# Patient Record
Sex: Female | Born: 1970
Health system: Southern US, Community
[De-identification: ages and names within clinical notes are randomized; demographics above are authoritative.]

## PROBLEM LIST (undated history)

## (undated) DIAGNOSIS — E079 Disorder of thyroid, unspecified: Secondary | ICD-10-CM

## (undated) DIAGNOSIS — R200 Anesthesia of skin: Secondary | ICD-10-CM

## (undated) DIAGNOSIS — R519 Headache, unspecified: Secondary | ICD-10-CM

## (undated) DIAGNOSIS — H539 Unspecified visual disturbance: Secondary | ICD-10-CM

## (undated) DIAGNOSIS — R51 Headache: Secondary | ICD-10-CM

## (undated) DIAGNOSIS — M5416 Radiculopathy, lumbar region: Secondary | ICD-10-CM

## (undated) DIAGNOSIS — R202 Paresthesia of skin: Secondary | ICD-10-CM

## (undated) HISTORY — DX: Unspecified visual disturbance: H53.9

## (undated) HISTORY — DX: Headache: R51

## (undated) HISTORY — DX: Disorder of thyroid, unspecified: E07.9

## (undated) HISTORY — DX: Anesthesia of skin: R20.2

## (undated) HISTORY — DX: Headache, unspecified: R51.9

## (undated) HISTORY — DX: Anesthesia of skin: R20.0

## (undated) HISTORY — PX: WISDOM TOOTH EXTRACTION: SHX21

---

## 2012-07-20 ENCOUNTER — Other Ambulatory Visit: Payer: Self-pay | Admitting: Gastroenterology

## 2012-07-20 DIAGNOSIS — R1011 Right upper quadrant pain: Secondary | ICD-10-CM

## 2012-07-22 ENCOUNTER — Ambulatory Visit
Admission: RE | Admit: 2012-07-22 | Discharge: 2012-07-22 | Disposition: A | Discharge status: Home / Self Care | Payer: BC Managed Care – PPO | Source: Ambulatory Visit | Attending: Gastroenterology | Admitting: Gastroenterology

## 2012-07-22 DIAGNOSIS — R1011 Right upper quadrant pain: Secondary | ICD-10-CM

## 2013-07-12 ENCOUNTER — Other Ambulatory Visit: Payer: Self-pay | Admitting: Obstetrics and Gynecology

## 2013-07-12 DIAGNOSIS — R928 Other abnormal and inconclusive findings on diagnostic imaging of breast: Secondary | ICD-10-CM

## 2013-07-28 ENCOUNTER — Encounter (INDEPENDENT_AMBULATORY_CARE_PROVIDER_SITE_OTHER): Payer: Self-pay

## 2013-07-28 ENCOUNTER — Ambulatory Visit
Admission: RE | Admit: 2013-07-28 | Discharge: 2013-07-28 | Disposition: A | Discharge status: Home / Self Care | Payer: BC Managed Care – PPO | Source: Ambulatory Visit | Attending: Obstetrics and Gynecology | Admitting: Obstetrics and Gynecology

## 2013-07-28 ENCOUNTER — Other Ambulatory Visit: Payer: Self-pay | Admitting: Obstetrics and Gynecology

## 2013-07-28 DIAGNOSIS — R928 Other abnormal and inconclusive findings on diagnostic imaging of breast: Secondary | ICD-10-CM

## 2013-12-29 ENCOUNTER — Other Ambulatory Visit: Payer: Self-pay | Admitting: Obstetrics and Gynecology

## 2013-12-29 DIAGNOSIS — N6310 Unspecified lump in the right breast, unspecified quadrant: Secondary | ICD-10-CM

## 2014-01-17 ENCOUNTER — Ambulatory Visit
Admission: RE | Admit: 2014-01-17 | Discharge: 2014-01-17 | Disposition: A | Discharge status: Home / Self Care | Payer: BC Managed Care – PPO | Source: Ambulatory Visit | Attending: Obstetrics and Gynecology | Admitting: Obstetrics and Gynecology

## 2014-01-17 DIAGNOSIS — N6310 Unspecified lump in the right breast, unspecified quadrant: Secondary | ICD-10-CM

## 2014-05-09 ENCOUNTER — Other Ambulatory Visit: Payer: Self-pay | Admitting: Obstetrics and Gynecology

## 2014-05-09 DIAGNOSIS — N6001 Solitary cyst of right breast: Secondary | ICD-10-CM

## 2014-05-11 ENCOUNTER — Ambulatory Visit
Admission: RE | Admit: 2014-05-11 | Discharge: 2014-05-11 | Disposition: A | Discharge status: Home / Self Care | Payer: BLUE CROSS/BLUE SHIELD | Source: Ambulatory Visit | Attending: Obstetrics and Gynecology | Admitting: Obstetrics and Gynecology

## 2014-05-11 DIAGNOSIS — N6001 Solitary cyst of right breast: Secondary | ICD-10-CM

## 2014-12-04 ENCOUNTER — Encounter: Payer: Self-pay | Admitting: Neurology

## 2014-12-04 ENCOUNTER — Ambulatory Visit (INDEPENDENT_AMBULATORY_CARE_PROVIDER_SITE_OTHER): Payer: BLUE CROSS/BLUE SHIELD | Admitting: Neurology

## 2014-12-04 VITALS — BP 122/80 | HR 84 | Ht 65.0 in | Wt 135.0 lb

## 2014-12-04 DIAGNOSIS — R202 Paresthesia of skin: Secondary | ICD-10-CM | POA: Diagnosis not present

## 2014-12-04 DIAGNOSIS — G43009 Migraine without aura, not intractable, without status migrainosus: Secondary | ICD-10-CM

## 2014-12-04 NOTE — Progress Notes (Signed)
PATIENT: Renee [name] DOB: 18-Apr-1970  Chief Complaint  Patient presents with  . 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  She describes it as having "wavy" peripheral vision.  The symptoms last 10-15 minutes and resolved on their own.  She did not have an active headache at the time.  . Numbness    She has had two episodes where she feels a heaviness in the back of her head.  She has also noticed tingling and cold sensations in her left arm.  She has mild neck pain.  . Fatigue    She has noticed increased fatigue, especially in the afternoon.   Marland Kitchen Headache    Reports getting frequent headaches.     HISTORICAL  Renee [name] is a 44 years old right-handed female, seen in refer by her primary care physician nurse practitioner Juanda Chance for evaluation of left-sided numbness  She is a Designer, multimedia, reported a history of migraine, which usually clustered around her menstruation period of time, or induced by exertion, lateralized severe pounding headaches with associated light noise sensitivity, nauseous.  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, she noticed visual distortion, as if it was wavy, last about 15 minutes, but there was no headaches,   She also reported one episode of headaches starting from right occipital region, sick in her stomach, pressure pain at right retro-orbital area, with associated left arm numbness, lasted for a few hours, gradually resolved,   Sometimes she get left retro-orbital area pressure headaches, with associated light noise sensitivity, nauseous,   Trigger for her headaches are stress, sleep deprivation, menstruation period of time   I reviewed most recent laboratory evaluation May 2016, normal CBC, with hemoglobin of 13.8, normal CMP.  REVIEW OF SYSTEMS: Full 14 system review of systems performed and notable only for: Fatigue, visual distortion,  headaches, numbness, dizziness, restless legs, anxiety, decreased energy, disinteresting activities, racing thoughts.  ALLERGIES: No Known Allergies  HOME MEDICATIONS: Current Outpatient Prescriptions  Medication Sig Dispense Refill  . Pseudoephedrine-Ibuprofen (ADVIL COLD/SINUS PO) Take by mouth as needed.    . ranitidine (ZANTAC) 150 MG tablet Take 150 mg by mouth 2 (two) times daily.     No current facility-administered medications for this visit.    PAST MEDICAL HISTORY: Past Medical History  Diagnosis Date  . Numbness and tingling in left arm   . Headache   . Vision disturbance     PAST SURGICAL HISTORY: Past Surgical History  Procedure Laterality Date  . Wisdom tooth extraction      FAMILY HISTORY: Family History  Problem Relation Age of Onset  . Diverticulitis Father   . Atrial fibrillation Mother   . Diabetes Mother   . Hypertension Mother     SOCIAL HISTORY:  Social History   Social History  . Marital Status: Unknown    Spouse Name: N/A  . Number of Children: 2  . 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 Smoking status: Never Smoker   . Smokeless tobacco: Not on file  . Alcohol Use: No  . Drug Use: No  . Sexual Activity: Not on file   Other Topics Concern  . Not on file   Social History Narrative   Lives at home with husband and two sons.  Right-handed.   1 cup caffeine daily.     PHYSICAL EXAM   Filed Vitals:   12/04/14 1156  BP: 122/80  Pulse: 84  Height: 5\' 5"  (1.651 m)  Weight: 135 lb (61.236 kg)    Not recorded      Body mass index is 22.47 kg/(m^2).  PHYSICAL EXAMNIATION:  Gen: NAD, conversant, well nourised, obese, well groomed                     Cardiovascular: Regular rate rhythm, no peripheral edema, warm, nontender. Eyes: Conjunctivae clear without exudates or hemorrhage Neck: Supple, no carotid bruise. Pulmonary: Clear to  auscultation bilaterally   NEUROLOGICAL EXAM:  MENTAL STATUS: Speech:    Speech is normal; fluent and spontaneous with normal comprehension.  Cognition:     Orientation to time, place and person     Normal recent and remote memory     Normal Attention span and concentration     Normal Language, naming, repeating,spontaneous speech     Fund of knowledge   CRANIAL NERVES: CN II: Visual fields are full to confrontation. Fundoscopic exam is normal with sharp discs and no vascular changes. Pupils are round equal and briskly reactive to light. CN III, IV, VI: extraocular movement are normal. No ptosis. CN V: Facial sensation is intact to pinprick in all 3 divisions bilaterally. Corneal responses are intact.  CN VII: Face is symmetric with normal eye closure and smile. CN VIII: Hearing is normal to rubbing fingers CN IX, X: Palate elevates symmetrically. Phonation is normal. CN XI: Head turning and shoulder shrug are intact CN XII: Tongue is midline with normal movements and no atrophy.  MOTOR: There is no pronator drift of out-stretched arms. Muscle bulk and tone are normal. Muscle strength is normal.  REFLEXES: Reflexes are 2+ and symmetric at the biceps, triceps, knees, and ankles. Plantar responses are flexor.  SENSORY: Intact to light touch, pinprick, position sense, and vibration sense are intact in fingers and toes.  COORDINATION: Rapid alternating movements and fine finger movements are intact. There is no dysmetria on finger-to-nose and heel-knee-shin.    GAIT/STANCE: Posture is normal. Gait is steady with normal steps, base, arm swing, and turning. Heel and toe walking are normal. Tandem gait is normal.  Romberg is absent.   DIAGNOSTIC DATA (LABS, IMAGING, TESTING) - I reviewed patient records, labs, notes, testing and imaging myself where available.   ASSESSMENT AND PLAN  Renee [name] is a 44 y.o. female    Migraine  With visual aura, sometimes with left  hemiparesthesia  After discussed with patient, will hold of imaging study at this point  If she continue experienced similar symptoms, will consider MRI of the brain,  I also suggested magnesium oxide, riboflavin as preventive medications, NSAIDs as needed   Marcial Pacas, M.D. Ph.D.  [facility] [address] [place], [place] [address] Ph: [phone] Fax: [phone]  CC: Juanda Chance, NP

## 2015-03-06 ENCOUNTER — Encounter: Payer: Self-pay | Admitting: Neurology

## 2015-03-06 ENCOUNTER — Ambulatory Visit (INDEPENDENT_AMBULATORY_CARE_PROVIDER_SITE_OTHER): Payer: BLUE CROSS/BLUE SHIELD | Admitting: Neurology

## 2015-03-06 VITALS — BP 116/80 | HR 79 | Ht 65.0 in | Wt 134.0 lb

## 2015-03-06 DIAGNOSIS — G43009 Migraine without aura, not intractable, without status migrainosus: Secondary | ICD-10-CM

## 2015-03-06 DIAGNOSIS — M542 Cervicalgia: Secondary | ICD-10-CM | POA: Diagnosis not present

## 2015-03-06 NOTE — Addendum Note (Signed)
Addended by: Marcial Pacas on: 03/06/2015 05:16 PM   Modules accepted: Level of Service

## 2015-03-06 NOTE — Progress Notes (Signed)
Chief Complaint  Patient presents with  . Migraine    Reports headaches have improved. Says she is only getting a severe headache during her menstrual cycle. She never started taking the riboflavin or magnesium oxide.  . Numbness    She is still having intermittent numbness in her left arm. She has also noticed numbness in her fourth toe that also comes and goes.      PATIENT: Renee [name] DOB: 09-05-1970  Chief Complaint  Patient presents with  . Migraine    Reports headaches have improved. Says she is only getting a severe headache during her menstrual cycle. She never started taking the riboflavin or magnesium oxide.  . Numbness    She is still having intermittent numbness in her left arm. She has also noticed numbness in her fourth toe that also comes and goes.     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, she noticed visual distortion, as if it was wavy, last about 15 minutes, but there was no headaches,   She also reported one episode of headaches starting from right occipital region, sick in her stomach, pressure pain at right retro-orbital area, with associated left arm numbness, lasted for a few hours, gradually resolved,   Sometimes she get left retro-orbital area pressure headaches, with associated light noise sensitivity, nauseous,   Trigger for her headaches are stress, sleep deprivation, menstruation period of time   I reviewed most recent laboratory evaluation May 2016, normal CBC, with hemoglobin of 13.8, normal CMP.  UPDATE Mar 06 2015: Her migraine headache has much improved, she still has intermittent left arm and hand numbness tingling, intermittent neck pain, but denied weakness, denied gait difficulty. Still able to exercise regularly.  REVIEW OF SYSTEMS: Full 14 system review of systems performed and notable only for: Fatigue, visual distortion, headaches, numbness, dizziness, restless legs, anxiety, decreased energy, disinteresting activities, racing thoughts.  ALLERGIES: No Known Allergies  HOME MEDICATIONS: Current Outpatient Prescriptions  Medication Sig Dispense Refill  . Pseudoephedrine-Ibuprofen (ADVIL COLD/SINUS PO) Take by mouth as needed.    . ranitidine (ZANTAC) 150 MG tablet Take 150 mg by mouth 2 (two) times daily.     No current facility-administered medications for this visit.    PAST MEDICAL HISTORY: Past Medical History  Diagnosis Date  . Numbness and tingling in left arm   . Headache   . Vision disturbance     PAST SURGICAL HISTORY: Past Surgical History  Procedure Laterality Date  . Wisdom tooth extraction      FAMILY HISTORY: Family History  Problem Relation Age of Onset  . Diverticulitis Father   . Atrial fibrillation Mother   . Diabetes Mother   . Hypertension Mother     SOCIAL HISTORY:  Social History   Social History  . Marital Status: Unknown    Spouse Name: N/A  . Number of Children: 2  . 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 Smoking status: Never Smoker   . Smokeless tobacco: Not on  file  . Alcohol Use: No  . Drug Use: No  . Sexual Activity: Not on file   Other Topics Concern  . Not on file   Social History Narrative   Lives at home with husband and two sons.   Right-handed.   1 cup caffeine daily.     PHYSICAL EXAM   Filed Vitals:   03/06/15 1139  BP: 116/80  Pulse: 79  Height: 5\' 5"  (1.651 m)  Weight: 134 lb (60.782 kg)    Not recorded        Body mass index is 22.3 kg/(m^2).  PHYSICAL EXAMNIATION:  Gen: NAD, conversant, well nourised, obese, well groomed                     Cardiovascular: Regular rate rhythm, no peripheral edema, warm, nontender. Eyes: Conjunctivae clear without exudates or hemorrhage Neck: Supple, no carotid bruise. Pulmonary: Clear to auscultation bilaterally   NEUROLOGICAL EXAM:  MENTAL STATUS: Speech:    Speech is normal; fluent and spontaneous with normal comprehension.  Cognition:     Orientation to time, place and person     Normal recent and remote memory     Normal Attention span and concentration     Normal Language, naming, repeating,spontaneous speech     Fund of knowledge   CRANIAL NERVES: CN II: Visual fields are full to confrontation. Fundoscopic exam is normal with sharp discs and no vascular changes. Pupils are round equal and briskly reactive to light. CN III, IV, VI: extraocular movement are normal. No ptosis. CN V: Facial sensation is intact to pinprick in all 3 divisions bilaterally. Corneal responses are intact.  CN VII: Face is symmetric with normal eye closure and smile. CN VIII: Hearing is normal to rubbing fingers CN IX, X: Palate elevates symmetrically. Phonation is normal. CN XI: Head turning and shoulder shrug are intact CN XII: Tongue is midline with normal movements and no atrophy.  MOTOR: There is no pronator drift of out-stretched arms. Muscle bulk and tone are normal. Muscle strength is normal.  REFLEXES: Reflexes are 2+ and symmetric at the biceps, triceps, knees, and ankles. Plantar responses are flexor.  SENSORY: Intact to light touch, pinprick, position sense, and vibration sense are intact in fingers and toes.  COORDINATION: Rapid alternating movements and fine finger movements are intact. There is no dysmetria on finger-to-nose and heel-knee-shin.    GAIT/STANCE: Posture is normal. Gait is steady with normal steps, base, arm swing, and turning.  Heel and toe walking are normal. Tandem gait is normal.  Romberg is absent.   DIAGNOSTIC DATA (LABS, IMAGING, TESTING) - I reviewed patient records, labs, notes, testing and imaging myself where available.   ASSESSMENT AND PLAN  Renee [name] is a 45 y.o. female    Migraine  With visual aura, sometimes with left hemiparesthesia  I also suggested magnesium oxide, riboflavin as preventive medications, NSAIDs as needed  Neck pain, left arm paresthesia  Differentiation diagnosis includes left cervical radiculopathy versus left upper extremity neuropathy,  She is highly functional,  Continue observe her symptoms  Will return to clinic for new issues  Marcial Pacas, M.D. Ph.D.  [facility] [address], [place], [place] [address] Ph: [phone] Fax: [phone]  CC: Juanda Chance, NP

## 2015-06-19 ENCOUNTER — Encounter: Payer: Self-pay | Admitting: Family Medicine

## 2015-06-19 ENCOUNTER — Ambulatory Visit (INDEPENDENT_AMBULATORY_CARE_PROVIDER_SITE_OTHER): Payer: BLUE CROSS/BLUE SHIELD | Admitting: Family Medicine

## 2015-06-19 ENCOUNTER — Ambulatory Visit (INDEPENDENT_AMBULATORY_CARE_PROVIDER_SITE_OTHER)
Admission: RE | Admit: 2015-06-19 | Discharge: 2015-06-19 | Disposition: A | Discharge status: Home / Self Care | Payer: BLUE CROSS/BLUE SHIELD | Source: Ambulatory Visit | Attending: Family Medicine | Admitting: Family Medicine

## 2015-06-19 VITALS — BP 120/72 | HR 71 | Ht 64.0 in | Wt 133.0 lb

## 2015-06-19 DIAGNOSIS — M545 Low back pain, unspecified: Secondary | ICD-10-CM

## 2015-06-19 DIAGNOSIS — M5416 Radiculopathy, lumbar region: Secondary | ICD-10-CM | POA: Insufficient documentation

## 2015-06-19 MED ORDER — PREDNISONE 50 MG PO TABS: 50.0000 mg | ORAL_TABLET | Freq: Every day | ORAL | Status: DC

## 2015-06-19 MED ORDER — TIZANIDINE HCL 4 MG PO TABS: 4.0000 mg | ORAL_TABLET | Freq: Three times a day (TID) | ORAL | Status: DC | PRN

## 2015-06-19 MED ORDER — GABAPENTIN 100 MG PO CAPS: 200.0000 mg | ORAL_CAPSULE | Freq: Every day | ORAL | Status: DC

## 2015-06-19 NOTE — Patient Instructions (Signed)
Good to see you  Ice 20 minutes 2 times daily. Usually after activity and before bed. Avoid exercises next 48 hours then start your home exercises 3 times a week only  Prednisone daily for 5 days Gabapentin 100mg  nightly for 1 week then 200mg  thereafter.  Zanaflex (muscle relaxer) up to 3 times a day as needed but may make you sleepy We will hold on vitamins for now See me again in 10-14 days to make sure you are doing better Also xray downstairs.

## 2015-06-19 NOTE — Assessment & Plan Note (Signed)
Patient does have some lumbar radiculopathy going down the left leg. I do think that this is the S1 nerve root based on patient's symptoms. Patient will be started on prednisone for the next 5 days and warned the potential side effects. We discussed gabapentin as well as a muscle relaxer. X-rays ordered today. Patient will hold exercises at this point. Patient come back in 10-14 days for further evaluation and treatment. At that time if doing well we will try to transition to natural medications as well as restart formal physical therapy.

## 2015-06-19 NOTE — Progress Notes (Signed)
Pre visit review using our clinic review tool, if applicable. No additional management support is needed unless otherwise documented below in the visit note. 

## 2015-06-19 NOTE — Progress Notes (Signed)
[name] [place], [place] [address] Phone: [phone] Subjective:    I'm seeing this patient by the request  of:  CURTIS,CAROL G, NP   CC: Left back pain and leg pain  [id number] Renee [name] is a 45 y.o. female coming in with complaint of left back pain. Has had this intermittently for multiple years. Seems to be worsening. Going to formal physical therapy at the moment but not having any significant improvement. Patient states that unfortunately now when she makes certain movements she has a radiation down the left leg. Seems to be on the posterior aspect and seems to go to the knee. Never quite past the knee. Denies any weakness or any numbness. States that if she changes her sleeping position that seems to make a difference. Patient rates the severity pain is 8 out of 10 that is stopping her from working out on a regular basis. Denies any fevers chills or any abnormal weight loss. Has not had any significant workup for this previously.     Past Medical History  Diagnosis Date  . Numbness and tingling in left arm   . Headache   . Vision disturbance    Past Surgical History  Procedure Laterality Date  . Wisdom tooth extraction     Social History   Social History  . Marital Status: Unknown    Spouse Name: N/A  . Number of Children: 2  . 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 Smoking status: Never Smoker   . Smokeless tobacco: None  . Alcohol Use: No  . Drug Use: No  . Sexual Activity: Not Asked   Other Topics Concern  . None   Social History Narrative   Lives at home with husband and two sons.   Right-handed.   1 cup caffeine daily.   No Known Allergies Family History  Problem Relation Age of Onset  . Diverticulitis Father   . Atrial fibrillation Mother   . Diabetes Mother   . Hypertension Mother     Past medical  history, social, surgical and family history all reviewed in electronic medical record.  No pertanent information unless stated regarding to the chief complaint.   Review of Systems: No headache, visual changes, nausea, vomiting, diarrhea, constipation, dizziness, abdominal pain, skin rash, fevers, chills, night sweats, weight loss, swollen lymph nodes, body aches, joint swelling, muscle aches, chest pain, shortness of breath, mood changes.   Objective Blood pressure 120/72, pulse 71, height 5\' 4"  (1.626 m), weight 133 lb (60.328 kg), SpO2 98 %.  General: No apparent distress alert and oriented x3 mood and affect normal, dressed appropriately.  HEENT: Pupils equal, extraocular movements intact  Respiratory: Patient's speak in full sentences and does not appear short of breath  Cardiovascular: No lower extremity edema, non tender, no erythema  Skin: Warm dry intact with no signs of infection or rash on extremities or on axial skeleton.  Abdomen: Soft nontender  Neuro: Cranial nerves II through XII are intact, neurovascularly intact in all extremities with 2+ DTRs and 2+ pulses.  Lymph: No lymphadenopathy of posterior or anterior cervical chain or axillae bilaterally.  Gait normal with good balance and coordination.  MSK:  Non tender with full range of motion and good stability and symmetric strength and tone of shoulders, elbows, wrist, hip, knee and ankles bilaterally.  Back Exam:  Inspection:  Unremarkable  Motion: Flexion 25 deg with radiation down the left leg, Extension 25 deg, Side Bending to 35 deg bilaterally,  Rotation to 35 deg bilaterally  SLR laying: Positive left side XSLR laying: Negative  Palpable tenderness: Tender over the L5-S1 area on the left FABER: negative. Sensory change: Gross sensation intact to all lumbar and sacral dermatomes.  Reflexes: 2+ at both patellar tendons, 2+ at achilles tendons, Babinski's downgoing.  Strength at foot  Plantar-flexion: 5/5 Dorsi-flexion:  5/5 Eversion: 5/5 Inversion: 5/5  Leg strength  Quad: 5/5 Hamstring: 5/5 Hip flexor: 5/5 Hip abductors: 5/5  Gait unremarkable.     Impression and Recommendations:     This case required medical decision making of moderate complexity.      Note: This dictation was prepared with Dragon dictation along with smaller phrase technology. Any transcriptional errors that result from this process are unintentional.

## 2015-06-20 ENCOUNTER — Telehealth: Payer: Self-pay

## 2015-06-20 NOTE — Telephone Encounter (Signed)
Patient called regarding my chart log in. She is having some trouble with getting in. Gave pt an alternative to get access. She will try that.   Pt asked about her xray. Gave pt results.

## 2015-06-20 NOTE — Telephone Encounter (Signed)
Noted  

## 2015-06-27 ENCOUNTER — Ambulatory Visit: Payer: BLUE CROSS/BLUE SHIELD | Admitting: Family Medicine

## 2015-07-03 ENCOUNTER — Ambulatory Visit (INDEPENDENT_AMBULATORY_CARE_PROVIDER_SITE_OTHER): Payer: BLUE CROSS/BLUE SHIELD | Admitting: Family Medicine

## 2015-07-03 ENCOUNTER — Encounter: Payer: Self-pay | Admitting: Family Medicine

## 2015-07-03 VITALS — BP 120/80 | HR 84 | Ht 64.0 in | Wt 135.0 lb

## 2015-07-03 DIAGNOSIS — M5416 Radiculopathy, lumbar region: Secondary | ICD-10-CM | POA: Diagnosis not present

## 2015-07-03 NOTE — Patient Instructions (Signed)
Good to see you Ice 20 minutes 2 times daily. Usually after activity and before bed. Continue the gabapentin 200mg  at night Duexis 3 times daily for 3 days then as needed Start the exercise 3-5 times a week.  Core strengthening good as well See me again in 4 weeks

## 2015-07-03 NOTE — Assessment & Plan Note (Signed)
Patient has made some improvement. Discussed with patient at length. We discussed different treatment options. We discussed restarting formal physical therapy which she has declined. Patient will continue with the home exercises and continue the gabapentin on a regular basis. Given a short trial of anti-inflammatories. We discussed icing regimen. We discussed the possibility of advanced imaging such as MRI secondary to the continued radicular symptoms. Patient would not be interested in an epidural,nerve root injection, or surgery so this would not change management at this moment. Encourage her to continue to start to increase her activity but if she has worsening numbness or weakness that to seek medical attention immediately. Patient will continue this regimen and come back and see me again in 4-6 weeks for further evaluation treatment. At that time we will discuss vitamin supplementation and potential osteopathic manipulation.  Spent  25 minutes with patient face-to-face and had greater than 50% of counseling including as described above in assessment and plan.

## 2015-07-03 NOTE — Progress Notes (Signed)
Pre visit review using our clinic review tool, if applicable. No additional management support is needed unless otherwise documented below in the visit note. 

## 2015-07-03 NOTE — Progress Notes (Signed)
[name] [place], [place] [address] Phone: [phone] Subjective:    I'm seeing this patient by the request  of:  CURTIS,CAROL G, NP   CC: Left back pain and leg pain f/u [id number] Renee [name] is a 45 y.o. female coming in with complaint of left back pain. Patient sent have more of a lumbar radiculopathy going down the left side. Seem to be in the S1 nerve root distribution. Patient was sent for x-rays. X-rays did show patient having moderate osteophytic changes at L5-S1. She was given prednisone and was to do home exercises and icing protocol. Patient states She is feeling 90% better while she was on the prednisone. Now is approximately 60% better. Still having the radicular symptoms going down the leg. Patient states that not as much weakness. States that she feels like she can do more activity. Concerned because she has not started it because she does not want her today. Patient denies any fevers chills any abnormal weight loss. Patient states that she is sleeping more comfortably at night. States no side effects to the gabapentin.     Past Medical History  Diagnosis Date  . Numbness and tingling in left arm   . Headache   . Vision disturbance    Past Surgical History  Procedure Laterality Date  . Wisdom tooth extraction     Social History   Social History  . Marital Status: Unknown    Spouse Name: N/A  . Number of Children: 2  . 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 Smoking status: Never Smoker   . Smokeless tobacco: None  . Alcohol Use: No  . Drug Use: No  . Sexual Activity: Not Asked   Other Topics Concern  . None   Social History Narrative   Lives at home with husband and two sons.   Right-handed.   1 cup caffeine daily.   No Known Allergies Family History  Problem Relation Age of Onset  . Diverticulitis Father     . Atrial fibrillation Mother   . Diabetes Mother   . Hypertension Mother     Past medical history, social, surgical and family history all reviewed in electronic medical record.  No pertanent information unless stated regarding to the chief complaint.   Review of Systems: No headache, visual changes, nausea, vomiting, diarrhea, constipation, dizziness, abdominal pain, skin rash, fevers, chills, night sweats, weight loss, swollen lymph nodes, body aches, joint swelling, muscle aches, chest pain, shortness of breath, mood changes.   Objective Blood pressure 120/80, pulse 84, height 5\' 4"  (1.626 m), weight 135 lb (61.236 kg), last menstrual period 06/05/2015, SpO2 98 %.  General: No apparent distress alert and oriented x3 mood and affect normal, dressed appropriately.  HEENT: Pupils equal, extraocular movements intact  Respiratory: Patient's speak in full sentences and does not appear short of breath  Cardiovascular: No lower extremity edema, non tender, no erythema  Skin: Warm dry intact with no signs of infection or rash on extremities or on axial skeleton.  Abdomen: Soft nontender  Neuro: Cranial nerves II through XII are intact, neurovascularly intact in all extremities with 2+ DTRs and 2+ pulses.  Lymph: No lymphadenopathy of posterior or anterior cervical chain or axillae bilaterally.  Gait normal with good balance and coordination.  MSK:  Non tender with full range of motion and good stability and  symmetric strength and tone of shoulders, elbows, wrist, hip, knee and ankles bilaterally.  Back Exam:  Inspection: Unremarkable  Motion: Flexion 25 deg with radiation down the left leg Still present Extension 25 deg, Side Bending to 35 deg bilaterally,  Rotation to 35 deg bilaterally  SLR laying: Positive left side but improved XSLR laying: Negative  Palpable tenderness: Tender over the L5-S1 area on the left still present. FABER: negative. Sensory change: Gross sensation intact to all  lumbar and sacral dermatomes.  Reflexes: 2+ at both patellar tendons, 2+ at achilles tendons, Babinski's downgoing.  Strength at foot  Plantar-flexion: 5/5 Dorsi-flexion: 5/5 Eversion: 5/5 Inversion: 5/5  Leg strength  Quad: 5/5 Hamstring: 5/5 Hip flexor: 5/5 Hip abductors: 5/5  Gait unremarkable.     Impression and Recommendations:     This case required medical decision making of moderate complexity.      Note: This dictation was prepared with Dragon dictation along with smaller phrase technology. Any transcriptional errors that result from this process are unintentional.

## 2015-07-10 DIAGNOSIS — Z01419 Encounter for gynecological examination (general) (routine) without abnormal findings: Secondary | ICD-10-CM | POA: Diagnosis not present

## 2015-07-10 DIAGNOSIS — N390 Urinary tract infection, site not specified: Secondary | ICD-10-CM | POA: Diagnosis not present

## 2015-07-19 ENCOUNTER — Other Ambulatory Visit: Payer: Self-pay | Admitting: *Deleted

## 2015-07-19 MED ORDER — GABAPENTIN 100 MG PO CAPS: 200.0000 mg | ORAL_CAPSULE | Freq: Every day | ORAL | Status: DC

## 2015-07-19 NOTE — Telephone Encounter (Signed)
Refill sent in to pharmacy 

## 2015-07-23 DIAGNOSIS — L259 Unspecified contact dermatitis, unspecified cause: Secondary | ICD-10-CM | POA: Diagnosis not present

## 2015-07-31 ENCOUNTER — Encounter: Payer: Self-pay | Admitting: Family Medicine

## 2015-07-31 ENCOUNTER — Ambulatory Visit (INDEPENDENT_AMBULATORY_CARE_PROVIDER_SITE_OTHER): Payer: BLUE CROSS/BLUE SHIELD | Admitting: Family Medicine

## 2015-07-31 VITALS — BP 116/80 | HR 66 | Wt 133.0 lb

## 2015-07-31 DIAGNOSIS — M5416 Radiculopathy, lumbar region: Secondary | ICD-10-CM

## 2015-07-31 MED ORDER — IBUPROFEN-FAMOTIDINE 800-26.6 MG PO TABS: 1.0000 | ORAL_TABLET | Freq: Three times a day (TID) | ORAL | Status: DC | PRN

## 2015-07-31 NOTE — Patient Instructions (Signed)
Good to see you  Ice is your friend  Try gabapentin at 100mg  for next 2 weeks.  If worsening go back to 200mg  if no change or better then discontinue after the 2 weeks.  Duexis daily for next week then as needed See andy again a couple times and see how you do Call me if worsening symptoms See me again in 4 weeks and we may start manipulation if needed.

## 2015-07-31 NOTE — Progress Notes (Signed)
[name] [place], [place] [address] Phone: [phone] Subjective:    I'm seeing this patient by the request  of:  Renee G, NP   CC: Left back pain and leg pain f/u [id number] Renee [name] is a 45 y.o. female coming in with complaint of left back pain. Patient sent have more of a lumbar radiculopathy going down the left side. Seem to be in the S1 nerve root distribution. Patient was sent for x-rays. X-rays did show patient having moderate osteophytic changes at L5-S1. She was given prednisone and was to do home exercises and icing protocol.  Patient did respond well to the prednisone previously. Is doing better overall. States that there is no longer having radicular symptoms down the leg but only more of a dull discomfort in the buttocks region. Patient has been very cautious in doing the exercises regularly. Patient will consider starting physical therapy again. Patient denies any weakness or any bowel or bladder incontinence. States that she is about 75-80% her baseline she would state. Patient wants to start becoming much more active so.     Past Medical History  Diagnosis Date  . Numbness and tingling in left arm   . Headache   . Vision disturbance    Past Surgical History  Procedure Laterality Date  . Wisdom tooth extraction     Social History   Social History  . Marital Status: Unknown    Spouse Name: N/A  . Number of Children: 2  . 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 Smoking status: Never Smoker   . Smokeless tobacco: None  . Alcohol Use: No  . Drug Use: No  . Sexual Activity: Not Asked   Other Topics Concern  . None   Social History Narrative   Lives at home with husband and two sons.   Right-handed.   1 cup caffeine daily.   No Known Allergies Family History  Problem Relation Age of Onset  . Diverticulitis  Father   . Atrial fibrillation Mother   . Diabetes Mother   . Hypertension Mother     Past medical history, social, surgical and family history all reviewed in electronic medical record.  No pertanent information unless stated regarding to the chief complaint.   Review of Systems: No headache, visual changes, nausea, vomiting, diarrhea, constipation, dizziness, abdominal pain, skin rash, fevers, chills, night sweats, weight loss, swollen lymph nodes, body aches, joint swelling, muscle aches, chest pain, shortness of breath, mood changes.   Objective Blood pressure 116/80, pulse 66, weight 133 lb (60.328 kg).  General: No apparent distress alert and oriented x3 mood and affect normal, dressed appropriately.  HEENT: Pupils equal, extraocular movements intact  Respiratory: Patient's speak in full sentences and does not appear short of breath  Cardiovascular: No lower extremity edema, non tender, no erythema  Skin: Warm dry intact with no signs of infection or rash on extremities or on axial skeleton.  Abdomen: Soft nontender  Neuro: Cranial nerves II through XII are intact, neurovascularly intact in all extremities with 2+ DTRs and 2+ pulses.  Lymph: No lymphadenopathy of posterior or anterior cervical chain or axillae bilaterally.  Gait normal with good balance and coordination.  MSK:  Non tender with full range of motion and good stability and symmetric strength and tone of shoulders, elbows, wrist, hip, knee and ankles bilaterally.  Back Exam:  Inspection: Unremarkable  Motion: Flexion 30 deg  Extension 25 deg, Side Bending to 35 deg bilaterally,  Rotation to 35 deg bilaterally  SLR laying: Negative XSLR laying: Negative  Palpable tenderness: Mild tenderness over the L5-S1 area on the left side still present FABER: negative. Sensory change: Gross sensation intact to all lumbar and sacral dermatomes.  Reflexes: 2+ at both patellar tendons, 2+ at achilles tendons, Babinski's downgoing.    Strength at foot  Plantar-flexion: 5/5 Dorsi-flexion: 5/5 Eversion: 5/5 Inversion: 5/5  Leg strength  Quad: 5/5 Hamstring: 5/5 Hip flexor: 5/5 Hip abductors: 5/5  Gait unremarkable.     Impression and Recommendations:     This case required medical decision making of moderate complexity.      Note: This dictation was prepared with Dragon dictation along with smaller phrase technology. Any transcriptional errors that result from this process are unintentional.

## 2015-07-31 NOTE — Assessment & Plan Note (Signed)
Patient is making progress. I do believe that she will do well overall. Patient will start titrating off the gabapentin and we started with formal physical therapy. We did send in any anti-inflammatory today to help with some of the inflammation that I think is still giving her some discomfort. We discussed the possibility of possible manipulation but we would like to wait Another 4 Weeks until She Is Nearly Pain-Free. 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 Follow-Up Again in 4 Weeks.  Spent  25 minutes with patient face-to-face and had greater than 50% of counseling including as described above in assessment and plan.

## 2015-08-01 ENCOUNTER — Ambulatory Visit: Payer: BLUE CROSS/BLUE SHIELD | Admitting: Family Medicine

## 2015-08-07 DIAGNOSIS — Z1231 Encounter for screening mammogram for malignant neoplasm of breast: Secondary | ICD-10-CM | POA: Diagnosis not present

## 2015-08-10 DIAGNOSIS — M545 Low back pain: Secondary | ICD-10-CM | POA: Diagnosis not present

## 2015-08-10 DIAGNOSIS — M7632 Iliotibial band syndrome, left leg: Secondary | ICD-10-CM | POA: Diagnosis not present

## 2015-08-15 DIAGNOSIS — M545 Low back pain: Secondary | ICD-10-CM | POA: Diagnosis not present

## 2015-08-15 DIAGNOSIS — M7632 Iliotibial band syndrome, left leg: Secondary | ICD-10-CM | POA: Diagnosis not present

## 2015-09-03 NOTE — Progress Notes (Signed)
[name] [place], [place] [address] Phone: [phone] Subjective:    I'm seeing this patient by the request  of:  CURTIS,CAROL G, NP   CC: Left back pain and leg pain f/u [id number]  Renee [name] is a 45 y.o. female coming in with complaint of left back pain. Patient sent have more of a lumbar radiculopathy going down the left side. Seem to be in the S1 nerve root distribution. Patient was sent for x-rays. X-rays did show patient having moderate osteophytic changes at L5-S1. Patient was doing significantly better but then when a road trip. Started having worsening pain in the lower back. Discuss it as a dull, throbbing aching sensation with some of the radiation down the left leg. Not as severe as it was previously but still more than he has been recently. Seems to be almost chronic. Rates the severity of pain is 4 out of 10. Does seem to get better with the stretching.     Past Medical History:  Diagnosis Date  . Headache   . Numbness and tingling in left arm   . Vision disturbance    Past Surgical History:  Procedure Laterality Date  . WISDOM TOOTH EXTRACTION     Social History   Social History  . Marital status: Unknown    Spouse name: N/A  . Number of children: 2  . 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 Smoking status: Never Smoker  . Smokeless tobacco: None  . Alcohol use No  . Drug use: No  . Sexual activity: Not Asked   Other Topics Concern  . None   Social History Narrative   Lives at home with husband and two sons.   Right-handed.   1 cup caffeine daily.   No Known Allergies Family History  Problem Relation Age of Onset  . Diverticulitis Father   . Atrial fibrillation Mother   . Diabetes Mother   . Hypertension Mother     Past medical history, social, surgical and family history all reviewed in electronic  medical record.  No pertanent information unless stated regarding to the chief complaint.   Review of Systems: No headache, visual changes, nausea, vomiting, diarrhea, constipation, dizziness, abdominal pain, skin rash, fevers, chills, night sweats, weight loss, swollen lymph nodes, body aches, joint swelling, muscle aches, chest pain, shortness of breath, mood changes.   Objective  Blood pressure 110/80, pulse 76, height 5\' 3"  (1.6 m), weight 133 lb (60.3 kg), SpO2 98 %.  General: No apparent distress alert and oriented x3 mood and affect normal, dressed appropriately.  HEENT: Pupils equal, extraocular movements intact  Respiratory: Patient's speak in full sentences and does not appear short of breath  Cardiovascular: No lower extremity edema, non tender, no erythema  Skin: Warm dry intact with no signs of infection or rash on extremities or on axial skeleton.  Abdomen: Soft nontender  Neuro: Cranial nerves II through XII are intact, neurovascularly intact in all extremities with 2+ DTRs and 2+ pulses.  Lymph: No lymphadenopathy of posterior or anterior cervical chain or axillae bilaterally.  Gait normal with good balance and coordination.  MSK:  Non tender with full range of motion and good stability and symmetric strength and tone of shoulders, elbows, wrist, hip, knee and ankles bilaterally.  Back Exam:  Inspection: Unremarkable  Motion: Flexion 40 deg  Extension 25  deg, Side Bending to 35 deg bilaterally,  Rotation to 35 deg bilaterally  SLR laying: Negative XSLR laying: Negative  Palpable tenderness:  Mild stiffness in the paraspinal musculaturelumbar spine FABER: negative. Sensory change: Gross sensation intact to all lumbar and sacral dermatomes.  Reflexes: 2+ at both patellar tendons, 2+ at achilles tendons, Babinski's downgoing.  Strength at foot  Plantar-flexion: 5/5 Dorsi-flexion: 5/5 Eversion: 5/5 Inversion: 5/5  Leg strength  Quad: 5/5 Hamstring: 5/5 Hip flexor: 5/5 Hip  abductors: 4/5  Gait unremarkable.    Osteopathic findings T3 extended rotated inside that right L2 flexed rotated and side bent left Sacrum left on left   Impression and Recommendations:     This case required medical decision making of moderate complexity.      Note: This dictation was prepared with Dragon dictation along with smaller phrase technology. Any transcriptional errors that result from this process are unintentional.

## 2015-09-04 ENCOUNTER — Ambulatory Visit (INDEPENDENT_AMBULATORY_CARE_PROVIDER_SITE_OTHER): Payer: BLUE CROSS/BLUE SHIELD | Admitting: Family Medicine

## 2015-09-04 ENCOUNTER — Encounter: Payer: Self-pay | Admitting: Family Medicine

## 2015-09-04 DIAGNOSIS — M9902 Segmental and somatic dysfunction of thoracic region: Secondary | ICD-10-CM

## 2015-09-04 DIAGNOSIS — M5416 Radiculopathy, lumbar region: Secondary | ICD-10-CM

## 2015-09-04 DIAGNOSIS — M9904 Segmental and somatic dysfunction of sacral region: Secondary | ICD-10-CM

## 2015-09-04 DIAGNOSIS — M999 Biomechanical lesion, unspecified: Secondary | ICD-10-CM | POA: Insufficient documentation

## 2015-09-04 DIAGNOSIS — M9903 Segmental and somatic dysfunction of lumbar region: Secondary | ICD-10-CM

## 2015-09-04 MED ORDER — GABAPENTIN 300 MG PO CAPS: ORAL_CAPSULE | ORAL | 3 refills | Status: DC

## 2015-09-04 NOTE — Patient Instructions (Signed)
God to see you  Keep it up I did manipulation today  Stay active. See me again in 3-4 weeks.

## 2015-09-04 NOTE — Assessment & Plan Note (Signed)
Decision today to treat with OMT was based on Physical Exam  After verbal consent patient was treated with HVLA, ME, FPR techniques in  thoracic, lumbar and sacral areas  Patient tolerated the procedure well with improvement in symptoms  Patient given exercises, stretches and lifestyle modifications  See medications in patient instructions if given  Patient will follow up in 3-4 weeks 

## 2015-09-04 NOTE — Assessment & Plan Note (Signed)
No significant radicular symptoms noted today. Patient subjectively states that she continues to have some no. We discussed advance imaging which patient declined. Increase patient's gabapentin. Attempted osteopathic manipulation with fairly good resolution of pain. We discussed with patient about continuing the course strength in the hip abductor strengthening. Follow-up again in 3-4 weeks. If worsening symptoms she will need further imaging.

## 2015-09-04 NOTE — Progress Notes (Signed)
Pre visit review using our clinic review tool, if applicable. No additional management support is needed unless otherwise documented below in the visit note. 

## 2015-09-24 NOTE — Assessment & Plan Note (Signed)
Patient's no longer was having any radicular symptoms. Does have the ibuprofen. Encourage patient to continue active to 80 and increasing activity. Responding well to osteopathic manipulation. Patient will follow-up again in 6-8 weeks.

## 2015-09-24 NOTE — Assessment & Plan Note (Signed)
Decision today to treat with OMT was based on Physical Exam  After verbal consent patient was treated with HVLA, ME, FPR techniques in  thoracic, lumbar and sacral areas  Patient tolerated the procedure well with improvement in symptoms  Patient given exercises, stretches and lifestyle modifications  See medications in patient instructions if given  Patient will follow up in  6-8 weeks 

## 2015-09-24 NOTE — Progress Notes (Signed)
[name] [place], [place] [address] Phone: [phone] Subjective:    I'm seeing this patient by the request  of:  CURTIS,CAROL G, NP   CC: Left back pain and leg pain f/u [id number]  Renee [name] is a 45 y.o. female coming in with complaint of left back pain. Patient sent have more of a lumbar radiculopathy going down the left side. Seem to be in the S1 nerve root distribution. Patient was sent for x-rays. X-rays did show patient having moderate osteophytic changes at L5-S1.  Patient seems to be doing a little bit better with the radicular symptoms. Continued to have pain in the lower back as well as some mild discomfort of the neck. Patient states Radicular symptoms is most nonexistent at this time. Patient still has some mild tightness. Nothing severe. Not stopping her from activity. Patient has not started working on a regular basis yet. Did respond very well to the osteopathic manipulation previously she states.     Past Medical History:  Diagnosis Date  . Headache   . Numbness and tingling in left arm   . Vision disturbance    Past Surgical History:  Procedure Laterality Date  . WISDOM TOOTH EXTRACTION     Social History   Social History  . Marital status: Unknown    Spouse name: N/A  . Number of children: 2  . 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 Smoking status: Never Smoker  . Smokeless tobacco: None  . Alcohol use No  . Drug use: No  . Sexual activity: Not Asked   Other Topics Concern  . None   Social History Narrative   Lives at home with husband and two sons.   Right-handed.   1 cup caffeine daily.   No Known Allergies Family History  Problem Relation Age of Onset  . Diverticulitis Father   . Atrial fibrillation Mother   . Diabetes Mother   . Hypertension Mother     Past medical history, social, surgical  and family history all reviewed in electronic medical record.  No pertanent information unless stated regarding to the chief complaint.   Review of Systems: No headache, visual changes, nausea, vomiting, diarrhea, constipation, dizziness, abdominal pain, skin rash, fevers, chills, night sweats, weight loss, swollen lymph nodes, body aches, joint swelling, muscle aches, chest pain, shortness of breath, mood changes.   Objective  Blood pressure 122/80, pulse 76, weight 135 lb (61.2 kg), SpO2 98 %.  General: No apparent distress alert and oriented x3 mood and affect normal, dressed appropriately.  HEENT: Pupils equal, extraocular movements intact  Respiratory: Patient's speak in full sentences and does not appear short of breath  Cardiovascular: No lower extremity edema, non tender, no erythema  Skin: Warm dry intact with no signs of infection or rash on extremities or on axial skeleton.  Abdomen: Soft nontender  Neuro: Cranial nerves II through XII are intact, neurovascularly intact in all extremities with 2+ DTRs and 2+ pulses.  Lymph: No lymphadenopathy of posterior or anterior cervical chain or axillae bilaterally.  Gait normal with good balance and coordination.  MSK:  Non tender with full range of motion and good stability and symmetric strength and tone of shoulders, elbows, wrist, hip, knee and ankles bilaterally.  Back Exam:  Inspection: Unremarkable  Motion: Flexion 45 deg  Extension 25 deg, Side Bending to  35 deg bilaterally,  Rotation to 35 deg bilaterally  SLR laying: Negative XSLR laying: Negative  Palpable tenderness:  Minimal tenderness on palpation today. Significant improvement FABER: negative. Sensory change: Gross sensation intact to all lumbar and sacral dermatomes.  Reflexes: 2+ at both patellar tendons, 2+ at achilles tendons, Babinski's downgoing.  Strength at foot  Plantar-flexion: 5/5 Dorsi-flexion: 5/5 Eversion: 5/5 Inversion: 5/5  Leg strength  Quad: 5/5  Hamstring: 5/5 Hip flexor: 5/5 Hip abductors: 4+/5  Gait unremarkable.    Osteopathic findings T3 extended rotated inside that right T5 extended rotated and side bent left L2 flexed rotated and side bent left Sacrum left on left   Impression and Recommendations:     This case required medical decision making of moderate complexity.      Note: This dictation was prepared with Dragon dictation along with smaller phrase technology. Any transcriptional errors that result from this process are unintentional.

## 2015-09-25 ENCOUNTER — Ambulatory Visit (INDEPENDENT_AMBULATORY_CARE_PROVIDER_SITE_OTHER): Payer: BLUE CROSS/BLUE SHIELD | Admitting: Family Medicine

## 2015-09-25 ENCOUNTER — Encounter: Payer: Self-pay | Admitting: Family Medicine

## 2015-09-25 VITALS — BP 122/80 | HR 76 | Wt 135.0 lb

## 2015-09-25 DIAGNOSIS — M9904 Segmental and somatic dysfunction of sacral region: Secondary | ICD-10-CM | POA: Diagnosis not present

## 2015-09-25 DIAGNOSIS — M9902 Segmental and somatic dysfunction of thoracic region: Secondary | ICD-10-CM | POA: Diagnosis not present

## 2015-09-25 DIAGNOSIS — M9903 Segmental and somatic dysfunction of lumbar region: Secondary | ICD-10-CM | POA: Diagnosis not present

## 2015-09-25 DIAGNOSIS — M5416 Radiculopathy, lumbar region: Secondary | ICD-10-CM

## 2015-09-25 DIAGNOSIS — M542 Cervicalgia: Secondary | ICD-10-CM

## 2015-09-25 DIAGNOSIS — M999 Biomechanical lesion, unspecified: Secondary | ICD-10-CM

## 2015-09-25 NOTE — Assessment & Plan Note (Signed)
Patient does not want [place] to touch in with osteopathic manipulation. Patient will start titrating down off the gabapentin. We will see her back again in 6-8 weeks.

## 2015-09-25 NOTE — Patient Instructions (Signed)
Great to see you  You are doing well Start to decrease gabapentin  Do 200mg  for 5 nights, then 100mg  for 5 nights OK to start increasing activity but slow Ice is your friend New exercises for hip flexor  See me again in 6-8 weeks.

## 2015-11-13 ENCOUNTER — Encounter: Payer: Self-pay | Admitting: Family Medicine

## 2015-11-13 ENCOUNTER — Ambulatory Visit (INDEPENDENT_AMBULATORY_CARE_PROVIDER_SITE_OTHER): Payer: BLUE CROSS/BLUE SHIELD | Admitting: Family Medicine

## 2015-11-13 VITALS — BP 122/80 | HR 69 | Wt 138.0 lb

## 2015-11-13 DIAGNOSIS — M5416 Radiculopathy, lumbar region: Secondary | ICD-10-CM | POA: Diagnosis not present

## 2015-11-13 DIAGNOSIS — M999 Biomechanical lesion, unspecified: Secondary | ICD-10-CM

## 2015-11-13 NOTE — Assessment & Plan Note (Signed)
Decision today to treat with OMT was based on Physical Exam  After verbal consent patient was treated with HVLA, ME, FPR techniques in  thoracic, lumbar and sacral areas  Patient tolerated the procedure well with improvement in symptoms  Patient given exercises, stretches and lifestyle modifications  See medications in patient instructions if given  Patient will follow up in 8 weeks 

## 2015-11-13 NOTE — Patient Instructions (Signed)
You are doing great  Keep up with the hip flexor and a little more core See me again in 2 months Vitamin D 2000 I U daily

## 2015-11-13 NOTE — Assessment & Plan Note (Signed)
Patient is no longer having radicular symptoms. Seems to be doing relatively well. Patient is not having any of the severe pain. Patient seems to be doing well but we encouraged her to do more of the core strengthening. Patient will see me again in 2 months for further evaluation and treatment.

## 2015-11-13 NOTE — Progress Notes (Signed)
[name] [name], [name] [address] Phone: [phone] Subjective:    CC: Left back pain and leg pain f/u [id number]  Renee [name] is a 45 y.o. female coming in with complaint of left back pain. Patient sent have more of a lumbar radiculopathy going down the left side. Seem to be in the S1 nerve root distribution. Patient was sent for x-rays. X-rays did show patient having moderate osteophytic changes at L5-S1.   Patient was last seen greater than 6 weeks ago. Patient was to continue conservative therapy. Patient states She is doing relatively well. Has stopped all gabapentin at this time.  Patient still has some mild back pain from time to time. No radiation. Continues to do stretching fairly reasonably. Working on the hip flexors.    Past Medical History:  Diagnosis Date  . Headache   . Numbness and tingling in left arm   . Vision disturbance    Past Surgical History:  Procedure Laterality Date  . WISDOM TOOTH EXTRACTION     Social History   Social History  . Marital status: Unknown    Spouse name: N/A  . Number of children: 2  . 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 Smoking status: Never Smoker  . Smokeless tobacco: Not on file  . Alcohol use No  . Drug use: No  . Sexual activity: Not on file   Other Topics Concern  . Not on file   Social History Narrative   Lives at home with husband and two sons.   Right-handed.   1 cup caffeine daily.   No Known Allergies Family History  Problem Relation Age of Onset  . Diverticulitis Father   . Atrial fibrillation Mother   . Diabetes Mother   . Hypertension Mother     Past medical history, social, surgical and family history all reviewed in electronic medical record.  No pertanent information unless stated regarding to the chief complaint.   Review of Systems: No headache,  visual changes, nausea, vomiting, diarrhea, constipation, dizziness, abdominal pain, skin rash, fevers, chills, night sweats, weight loss, swollen lymph nodes, body aches, joint swelling, muscle aches, chest pain, shortness of breath, mood changes.   Objective  There were no vitals taken for this visit.  General: No apparent distress alert and oriented x3 mood and affect normal, dressed appropriately.  HEENT: Pupils equal, extraocular movements intact  Respiratory: Patient's speak in full sentences and does not appear short of breath  Cardiovascular: No lower extremity edema, non tender, no erythema  Skin: Warm dry intact with no signs of infection or rash on extremities or on axial skeleton.  Abdomen: Soft nontender  Neuro: Cranial nerves II through XII are intact, neurovascularly intact in all extremities with 2+ DTRs and 2+ pulses.  Lymph: No lymphadenopathy of posterior or anterior cervical chain or axillae bilaterally.  Gait normal with good balance and coordination.  MSK:  Non tender with full range of motion and good stability and symmetric strength and tone of shoulders, elbows, wrist, hip, knee and ankles bilaterally.  Back Exam:  Inspection: Unremarkable  Motion: Flexion 45 deg  Extension 25 deg, Side Bending to 45 deg bilaterally,  Rotation to 45 deg bilaterally  SLR laying: Negative XSLR laying: Negative  Palpable tenderness:  Very minimal tenderness to palpation of the paraspinal musculature today. FABER: negative. Sensory change: Gross  sensation intact to all lumbar and sacral dermatomes.  Reflexes: 2+ at both patellar tendons, 2+ at achilles tendons, Babinski's downgoing.  Strength at foot  Plantar-flexion: 5/5 Dorsi-flexion: 5/5 Eversion: 5/5 Inversion: 5/5  Leg strength  Quad: 5/5 Hamstring: 5/5 Hip flexor: 5/5 Hip abductors: 4+/5  Gait unremarkable.  Mild improvement   Osteopathic findings T3 extended rotated inside that right T7 extended rotated and side bent  left L2 flexed rotated and side bent right Sacrum left on left   Impression and Recommendations:     This case required medical decision making of moderate complexity.      Note: This dictation was prepared with Dragon dictation along with smaller phrase technology. Any transcriptional errors that result from this process are unintentional.

## 2016-01-14 NOTE — Progress Notes (Signed)
[name] [place], [place] [address] Phone: [phone] Subjective:    CC: Left back pain and leg pain f/u [id number]  Renee [name] is a 45 y.o. female coming in with complaint of left back pain. Patient sent have more of a lumbar radiculopathy going down the left side. Seem to be in the S1 nerve root distribution. Patient was sent for x-rays. X-rays did show patient having moderate osteophytic changes at L5-S1.   Patient has been doing very well with conservative therapy. Has responded very well to osteopathic manipulation. Patient was to continue with conservative therapy. Patient states Overall she has been doing really well. Patient has started doing Pilates and is seems to have made a difference. Continues to stay active.    Past Medical History:  Diagnosis Date  . Headache   . Numbness and tingling in left arm   . Vision disturbance    Past Surgical History:  Procedure Laterality Date  . WISDOM TOOTH EXTRACTION     Social History   Social History  . Marital status: Unknown    Spouse name: N/A  . Number of children: 2  . 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 Smoking status: Never Smoker  . Smokeless tobacco: None  . Alcohol use No  . Drug use: No  . Sexual activity: Not Asked   Other Topics Concern  . None   Social History Narrative   Lives at home with husband and two sons.   Right-handed.   1 cup caffeine daily.   No Known Allergies Family History  Problem Relation Age of Onset  . Diverticulitis Father   . Atrial fibrillation Mother   . Diabetes Mother   . Hypertension Mother     Past medical history, social, surgical and family history all reviewed in electronic medical record.  No pertanent information unless stated regarding to the chief complaint.   Review of Systems: No headache, visual changes, nausea,  vomiting, diarrhea, constipation, dizziness, abdominal pain, skin rash, fevers, chills, night sweats, weight loss, swollen lymph nodes, body aches, joint swelling, muscle aches, chest pain, shortness of breath, mood changes.   Objective  Blood pressure 128/82, pulse 65, height 5\' 4"  (1.626 m), weight 139 lb (63 kg), SpO2 99 %.  Systems examined below as of 01/15/16 General: NAD A&O x3 mood, affect normal  HEENT: Pupils equal, extraocular movements intact no nystagmus Respiratory: not short of breath at rest or with speaking Cardiovascular: No lower extremity edema, non tender Skin: Warm dry intact with no signs of infection or rash on extremities or on axial skeleton. Abdomen: Soft nontender, no masses Neuro: Cranial nerves  intact, neurovascularly intact in all extremities with 2+ DTRs and 2+ pulses. Lymph: No lymphadenopathy appreciated today  Gait normal with good balance and coordination.  MSK: Non tender with full range of motion and good stability and symmetric strength and tone of shoulders, elbows, wrist,  knee hips and ankles bilaterally.   Back Exam:  Inspection: Unremarkable  Motion: Flexion 45 deg  Extension 25 deg, Side Bending to 45 deg bilaterally,  Rotation to 45 deg bilaterally  SLR laying: Negative XSLR laying: Negative  Palpable tenderness:  Nontender on exam today FABER: negative. Sensory change: Gross sensation intact to all lumbar and sacral dermatomes.  Reflexes: 2+ at both patellar tendons, 2+ at achilles tendons, Babinski's downgoing.  Strength  at foot  Plantar-flexion: 5/5 Dorsi-flexion: 5/5 Eversion: 5/5 Inversion: 5/5  Leg strength  Quad: 5/5 Hamstring: 5/5 Hip flexor: 5/5 Hip abductors: 4+/5  Gait unremarkable.  Continued improvement   Osteopathic findings T3 extended rotated inside that right T5 extended rotated and side bent left L2 flexed rotated and side bent right Sacrum left on left   Impression and Recommendations:     This case required  medical decision making of moderate complexity.      Note: This dictation was prepared with Dragon dictation along with smaller phrase technology. Any transcriptional errors that result from this process are unintentional.

## 2016-01-15 ENCOUNTER — Ambulatory Visit (INDEPENDENT_AMBULATORY_CARE_PROVIDER_SITE_OTHER): Payer: BLUE CROSS/BLUE SHIELD | Admitting: Family Medicine

## 2016-01-15 ENCOUNTER — Encounter: Payer: Self-pay | Admitting: Family Medicine

## 2016-01-15 VITALS — BP 128/82 | HR 65 | Ht 64.0 in | Wt 139.0 lb

## 2016-01-15 DIAGNOSIS — M5416 Radiculopathy, lumbar region: Secondary | ICD-10-CM | POA: Diagnosis not present

## 2016-01-15 DIAGNOSIS — M542 Cervicalgia: Secondary | ICD-10-CM

## 2016-01-15 DIAGNOSIS — M999 Biomechanical lesion, unspecified: Secondary | ICD-10-CM | POA: Diagnosis not present

## 2016-01-15 NOTE — Patient Instructions (Signed)
God to see you  Renee [name] is your friend. Stay active overall.  Keep working on posture and core Love the pilates Continue vitamin D at least and up to you on the others See me again in 3 months!

## 2016-01-15 NOTE — Assessment & Plan Note (Signed)
Patient is having no radicular symptoms. Continue to improvement with her core strength. We discussed no significant change in management. Patient will continue with current therapy. Follow-up again in 3 months.

## 2016-01-15 NOTE — Assessment & Plan Note (Signed)
stable °

## 2016-01-15 NOTE — Assessment & Plan Note (Signed)
Decision today to treat with OMT was based on Physical Exam  After verbal consent patient was treated with HVLA, ME, FPR techniques in  thoracic, lumbar and sacral areas  Patient tolerated the procedure well with improvement in symptoms  Patient given exercises, stretches and lifestyle modifications  See medications in patient instructions if given  Patient will follow up in 12 weeks 

## 2016-04-15 ENCOUNTER — Encounter: Payer: Self-pay | Admitting: Family Medicine

## 2016-04-15 ENCOUNTER — Ambulatory Visit (INDEPENDENT_AMBULATORY_CARE_PROVIDER_SITE_OTHER): Payer: BLUE CROSS/BLUE SHIELD | Admitting: Family Medicine

## 2016-04-15 VITALS — BP 128/82 | HR 83 | Ht 64.0 in | Wt 135.0 lb

## 2016-04-15 DIAGNOSIS — M999 Biomechanical lesion, unspecified: Secondary | ICD-10-CM | POA: Diagnosis not present

## 2016-04-15 DIAGNOSIS — M5416 Radiculopathy, lumbar region: Secondary | ICD-10-CM | POA: Diagnosis not present

## 2016-04-15 NOTE — Progress Notes (Signed)
[name] [place], [place] [address] Phone: [phone] Subjective:    CC: Left back pain and leg pain f/u [id number]  Renee [name] is a 46 y.o. female coming in with complaint of left back pain. Patient sent have more of a lumbar radiculopathy going down the left side. Seem to be in the S1 nerve root distribution. Patient was sent for x-rays. X-rays did show patient having moderate osteophytic changes at L5-S1.   Patient though has been doing remarkably better. Has been 3 months since we have seen patient. Did respond very well to the osteopathic manipulation. Not having any radicular symptoms at this time but is having some increasing tightness of the lower back. Patient has not been as active as she used to. Not doing exercises as regularly.    Past Medical History:  Diagnosis Date  . Headache   . Numbness and tingling in left arm   . Vision disturbance    Past Surgical History:  Procedure Laterality Date  . WISDOM TOOTH EXTRACTION     Social History   Social History  . Marital status: Unknown    Spouse name: N/A  . Number of children: 2  . 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 Smoking status: Never Smoker  . Smokeless tobacco: Never Used  . Alcohol use No  . Drug use: No  . Sexual activity: Not Asked   Other Topics Concern  . None   Social History Narrative   Lives at home with husband and two sons.   Right-handed.   1 cup caffeine daily.   No Known Allergies Family History  Problem Relation Age of Onset  . Diverticulitis Father   . Atrial fibrillation Mother   . Diabetes Mother   . Hypertension Mother     Past medical history, social, surgical and family history all reviewed in electronic medical record.  No pertanent information unless stated regarding to the chief complaint.   Review of Systems: No headache,  visual changes, nausea, vomiting, diarrhea, constipation, dizziness, abdominal pain, skin rash, fevers, chills, night sweats, weight loss, swollen lymph nodes, body aches, joint swelling, muscle aches, chest pain, shortness of breath, mood changes.    Objective  Blood pressure 128/82, pulse 83, height 5\' 4"  (1.626 m), weight 135 lb (61.2 kg), SpO2 98 %.  Systems examined below as of 04/15/16 General: NAD A&O x3 mood, affect normal  HEENT: Pupils equal, extraocular movements intact no nystagmus Respiratory: not short of breath at rest or with speaking Cardiovascular: No lower extremity edema, non tender Skin: Warm dry intact with no signs of infection or rash on extremities or on axial skeleton. Abdomen: Soft nontender, no masses Neuro: Cranial nerves  intact, neurovascularly intact in all extremities with 2+ DTRs and 2+ pulses. Lymph: No lymphadenopathy appreciated today  Gait normal with good balance and coordination.  MSK: Non tender with full range of motion and good stability and symmetric strength and tone of shoulders, elbows, wrist,  knee hips and ankles bilaterally.   Back Exam:  Inspection: Unremarkable  Motion: Flexion 35 deg  Extension 25 deg, Side Bending to 45 deg bilaterally,  Rotation to 35 deg bilaterally increasing tightness from previous exam SLR laying: Negative XSLR laying: Negative  Palpable tenderness: Increasing tenderness in the paraspinal musculature of the thoracolumbar juncture bilaterally FABER: negative. Sensory change: Gross sensation intact to  all lumbar and sacral dermatomes.  Reflexes: 2+ at both patellar tendons, 2+ at achilles tendons, Babinski's downgoing.  Strength at foot  Plantar-flexion: 5/5 Dorsi-flexion: 5/5 Eversion: 5/5 Inversion: 5/5  Leg strength  Quad: 5/5 Hamstring: 5/5 Hip flexor: 5/5 Hip abductors: 4+/5  Gait unremarkable.  Worsening from previous exam   Osteopathic findings T3 extended rotated and side bent right inhaled third  rib T9 extended rotated and side bent left L2 flexed rotated and side bent right Sacrum right on right    Impression and Recommendations:     This case required medical decision making of moderate complexity.      Note: This dictation was prepared with Dragon dictation along with smaller phrase technology. Any transcriptional errors that result from this process are unintentional.

## 2016-04-15 NOTE — Patient Instructions (Signed)
Good to see you as always.  We will see you again in 6 weeks.  Ice is your friend  Stay active.  Watch the posture and work on the hip flexor stretch we showed you  See me again in 6 weeks.

## 2016-04-15 NOTE — Assessment & Plan Note (Signed)
Longer having the radicular symptoms but patient does have known degenerative changes of the back. We discussed which activities to do a which ones to avoid. Patient will increase activity as tolerated. Patient will see me on 6 weeks with her having worsening symptoms with increasing tightness. FloSeal patient responds. Once again discussed core instability training.

## 2016-05-27 ENCOUNTER — Ambulatory Visit: Payer: BLUE CROSS/BLUE SHIELD | Admitting: Family Medicine

## 2016-05-28 ENCOUNTER — Ambulatory Visit (INDEPENDENT_AMBULATORY_CARE_PROVIDER_SITE_OTHER): Payer: BLUE CROSS/BLUE SHIELD | Admitting: Family Medicine

## 2016-05-28 ENCOUNTER — Encounter: Payer: Self-pay | Admitting: Family Medicine

## 2016-05-28 VITALS — BP 110/76 | HR 93 | Wt 131.0 lb

## 2016-05-28 DIAGNOSIS — M5416 Radiculopathy, lumbar region: Secondary | ICD-10-CM | POA: Diagnosis not present

## 2016-05-28 DIAGNOSIS — M999 Biomechanical lesion, unspecified: Secondary | ICD-10-CM

## 2016-05-28 NOTE — Progress Notes (Signed)
Pre-visit discussion using our clinic review tool. No additional management support is needed unless otherwise documented below in the visit note.  

## 2016-05-28 NOTE — Assessment & Plan Note (Signed)
No radicular symptoms. Patient is doing very good with conservative therapy. We discussed icing regimen and home exercises. We discussed which activities doing which ones to avoid. Patient will increase activity slowly over the course of time. Encourage her to continue to work out. Patient was identified calcific shoulder doing significantly better. Follow-up again with me in 6-12 weeks

## 2016-05-28 NOTE — Assessment & Plan Note (Signed)
Decision today to treat with OMT was based on Physical Exam  After verbal consent patient was treated with HVLA, ME, FPR techniques in  thoracic, lumbar and sacral areas  Patient tolerated the procedure well with improvement in symptoms  Patient given exercises, stretches and lifestyle modifications  See medications in patient instructions if given  Patient will follow up in 6-12 weeks 

## 2016-05-28 NOTE — Patient Instructions (Signed)
Good to see you  I am so impressed!!!!! You are doing great  Do not change a thing See me again in 6-8 weeks

## 2016-05-28 NOTE — Progress Notes (Signed)
[name] [place],  [address] Phone: [phone] Subjective:    CC: Left back pain and leg pain f/u [id number]  Renee [name] is a 46 y.o. female coming in with complaint of left back pain. Patient sent have more of a lumbar radiculopathy going down the left side. Seem to be in the S1 nerve root distribution. Patient was sent for x-rays. X-rays did show patient having moderate osteophytic changes at L5-S1.   Patient is been doing remarkably well with conservative therapy and osteopathic manipulation. Patient has lost weight and is feeling very. Patient is working on a regular basis. Very happy with results of far.    Past Medical History:  Diagnosis Date  . Headache   . Numbness and tingling in left arm   . Vision disturbance    Past Surgical History:  Procedure Laterality Date  . WISDOM TOOTH EXTRACTION     Social History   Social History  . Marital status: Unknown    Spouse name: N/A  . Number of children: 2  . 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 Smoking status: Never Smoker  . Smokeless tobacco: Never Used  . Alcohol use No  . Drug use: No  . Sexual activity: Not Asked   Other Topics Concern  . None   Social History Narrative   Lives at home with husband and two sons.   Right-handed.   1 cup caffeine daily.   No Known Allergies Family History  Problem Relation Age of Onset  . Diverticulitis Father   . Atrial fibrillation Mother   . Diabetes Mother   . Hypertension Mother     Past medical history, social, surgical and family history all reviewed in electronic medical record.  No pertanent information unless stated regarding to the chief complaint.   Review of Systems: No headache, visual changes, nausea, vomiting, diarrhea, constipation, dizziness, abdominal pain, skin rash, fevers, chills, night sweats,  weight loss, swollen lymph nodes, body aches, joint swelling, muscle aches, chest pain, shortness of breath, mood changes.      Objective  Blood pressure 110/76, pulse 93, weight 131 lb (59.4 kg), SpO2 93 %.  Systems examined below as of 05/28/16 General: NAD A&O x3 mood, affect normal  HEENT: Pupils equal, extraocular movements intact no nystagmus Respiratory: not short of breath at rest or with speaking Cardiovascular: No lower extremity edema, non tender Skin: Warm dry intact with no signs of infection or rash on extremities or on axial skeleton. Abdomen: Soft nontender, no masses Neuro: Cranial nerves  intact, neurovascularly intact in all extremities with 2+ DTRs and 2+ pulses. Lymph: No lymphadenopathy appreciated today  Gait normal with good balance and coordination.  MSK: Non tender with full range of motion and good stability and symmetric strength and tone of shoulders, elbows, wrist,  knee hips and ankles bilaterally.  .   Back Exam:  Inspection: Unremarkable significant increase in strengthening the core Motion: Flexion 45 deg, Extension 45 deg, Side Bending to 45 deg bilaterally,  Rotation to 45 deg bilaterally  SLR laying: Negative  XSLR laying: Negative  Palpable tenderness: Minimal tenderness to palpation of the paraspinal musculature of the lumbar spine really around right sacroiliac joint. FABER: negative. Sensory change: Gross sensation intact to all lumbar and sacral dermatomes.  Reflexes: 2+ at both patellar tendons, 2+ at achilles tendons, Babinski's downgoing.  Strength at foot  Plantar-flexion: 5/5 Dorsi-flexion: 5/5 Eversion: 5/5 Inversion: 5/5  Leg strength  Quad: 5/5 Hamstring: 5/5 Hip flexor: 5/5 Hip abductors: 5/5  Gait unremarkable.    Osteopathic findings  T3 extended rotated and side bent right inhaled third rib T9 extended rotated and side bent left L2 flexed rotated and side bent right Sacrum right on right     Impression and  Recommendations:     This case required medical decision making of moderate complexity.      Note: This dictation was prepared with Dragon dictation along with smaller phrase technology. Any transcriptional errors that result from this process are unintentional.

## 2016-06-06 DIAGNOSIS — N8182 Incompetence or weakening of pubocervical tissue: Secondary | ICD-10-CM | POA: Diagnosis not present

## 2016-07-11 DIAGNOSIS — H16102 Unspecified superficial keratitis, left eye: Secondary | ICD-10-CM | POA: Diagnosis not present

## 2016-07-19 NOTE — Progress Notes (Signed)
[name] [place], [place] [address] Phone: [phone] Subjective:    CC: Low back pain  [id number]  Renee [name] is a 46 y.o. female coming in with complaint of low back pain. Patient has been very active. Doing yoga regularly but feels like she has more difficulty with most the pelvic floor in the right side hip. Patient describes it as a dull, throbbing aching pain. States that certain times can have sharp pain in the middle stay around for multiple days. Then seems to go away. Denies any radiation down the leg, any numbness. No association with bowel movement.     Past Medical History:  Diagnosis Date  . Headache   . Numbness and tingling in left arm   . Vision disturbance    Past Surgical History:  Procedure Laterality Date  . WISDOM TOOTH EXTRACTION     Social History   Social History  . Marital status: Unknown    Spouse name: N/A  . Number of children: 2  . 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 Smoking status: Never Smoker  . Smokeless tobacco: Never Used  . Alcohol use No  . Drug use: No  . Sexual activity: Not Asked   Other Topics Concern  . None   Social History Narrative   Lives at home with husband and two sons.   Right-handed.   1 cup caffeine daily.   No Known Allergies Family History  Problem Relation Age of Onset  . Diverticulitis Father   . Atrial fibrillation Mother   . Diabetes Mother   . Hypertension Mother     Past medical history, social, surgical and family history all reviewed in electronic medical record.  No pertanent information unless stated regarding to the chief complaint.   Review of Systems:Review of systems updated and as accurate as of 07/21/16  No headache, visual changes, nausea, vomiting, diarrhea, constipation, dizziness, abdominal pain, skin rash, fevers, chills, night sweats,  weight loss, swollen lymph nodes, body aches, joint swelling,  chest pain, shortness of breath, mood changes. Positive muscle aches  Objective  Blood pressure 118/78, pulse 74, height 5\' 4"  (1.626 m), weight 133 lb (60.3 kg), SpO2 97 %. Systems examined below as of 07/21/16   General: No apparent distress alert and oriented x3 mood and affect normal, dressed appropriately.  HEENT: Pupils equal, extraocular movements intact  Respiratory: Patient's speak in full sentences and does not appear short of breath  Cardiovascular: No lower extremity edema, non tender, no erythema  Skin: Warm dry intact with no signs of infection or rash on extremities or on axial skeleton.  Abdomen: Soft nontender  Neuro: Cranial nerves II through XII are intact, neurovascularly intact in all extremities with 2+ DTRs and 2+ pulses.  Lymph: No lymphadenopathy of posterior or anterior cervical chain or axillae bilaterally.  Gait normal with good balance and coordination.  MSK:  Non tender with full range of motion and good stability and symmetric strength and tone of shoulders, elbows, wrist, hip, knee and ankles bilaterally.  Back Exam:  Inspection: Unremarkable  Motion: Flexion 35 deg, Extension 25 deg, Side Bending to 45 deg bilaterally,  Rotation to 45 deg bilaterally  SLR laying: Negative  XSLR laying: Negative  Palpable tenderness: Tender to palpation in the paraspinal musculature lumbar spine.. Patient also has some pain in the obturator on the  right side. FABER: Mild tightness of the right side. Sensory change: Gross sensation intact to all lumbar and sacral dermatomes.  Reflexes: 2+ at both patellar tendons, 2+ at achilles tendons, Babinski's downgoing.  Strength at foot  Plantar-flexion: 5/5 Dorsi-flexion: 5/5 Eversion: 5/5 Inversion: 5/5  Leg strength  Quad: 5/5 Hamstring: 5/5 Hip flexor: 5/5 Hip abductors: 4/5 but symmetric  Osteopathic findings Cervical C2 flexed rotated and side bent right C4  flexed rotated and side bent left C6 flexed rotated and side bent left T3 extended rotated and side bent right inhaled third rib T9 extended rotated and side bent left L2 flexed rotated and side bent right Sacrum right on right Pelvic floor with anterior ilium on right      Impression and Recommendations:     This case required medical decision making of moderate complexity.      Note: This dictation was prepared with Dragon dictation along with smaller phrase technology. Any transcriptional errors that result from this process are unintentional.

## 2016-07-21 ENCOUNTER — Ambulatory Visit (INDEPENDENT_AMBULATORY_CARE_PROVIDER_SITE_OTHER): Payer: BLUE CROSS/BLUE SHIELD | Admitting: Family Medicine

## 2016-07-21 ENCOUNTER — Encounter: Payer: Self-pay | Admitting: Family Medicine

## 2016-07-21 VITALS — BP 118/78 | HR 74 | Ht 64.0 in | Wt 133.0 lb

## 2016-07-21 DIAGNOSIS — M6289 Other specified disorders of muscle: Secondary | ICD-10-CM | POA: Diagnosis not present

## 2016-07-21 DIAGNOSIS — M999 Biomechanical lesion, unspecified: Secondary | ICD-10-CM | POA: Insufficient documentation

## 2016-07-21 NOTE — Assessment & Plan Note (Signed)
Patient is having worsening pelvic floor dysfunction believe. Patient does not have any dysuria at this point and polyuria. Discussed with patient's mom about isometric exercises. We discussed which activities to do in which ones to avoid. Patient is not having anything that is constant. Patient will be considering formal physical therapy. We discussed icing regimen. Follow-up again in 4 weeks.

## 2016-07-21 NOTE — Patient Instructions (Signed)
God to see yo  Focus on the pelvic floor Planks and iso metric may be better Leave Yoga a little early See you again in 4 weeks.

## 2016-07-21 NOTE — Assessment & Plan Note (Addendum)
Decision today to treat with OMT was based on Physical Exam  After verbal consent patient was treated with HVLA, ME, FPR techniques in cervical, thoracic, lumbar and sacral  And pelvisareas  Patient tolerated the procedure well with improvement in symptoms  Patient given exercises, stretches and lifestyle modifications  See medications in patient instructions if given  Patient will follow up in 4 weeks

## 2016-08-07 DIAGNOSIS — N8182 Incompetence or weakening of pubocervical tissue: Secondary | ICD-10-CM | POA: Diagnosis not present

## 2016-08-07 DIAGNOSIS — Z01419 Encounter for gynecological examination (general) (routine) without abnormal findings: Secondary | ICD-10-CM | POA: Diagnosis not present

## 2016-08-07 DIAGNOSIS — Z1231 Encounter for screening mammogram for malignant neoplasm of breast: Secondary | ICD-10-CM | POA: Diagnosis not present

## 2016-08-07 DIAGNOSIS — Z6822 Body mass index (BMI) 22.0-22.9, adult: Secondary | ICD-10-CM | POA: Diagnosis not present

## 2016-08-08 ENCOUNTER — Telehealth: Payer: Self-pay | Admitting: Pediatrics

## 2016-08-15 DIAGNOSIS — M79606 Pain in leg, unspecified: Secondary | ICD-10-CM | POA: Diagnosis not present

## 2016-08-15 DIAGNOSIS — M25552 Pain in left hip: Secondary | ICD-10-CM | POA: Diagnosis not present

## 2016-08-15 DIAGNOSIS — R102 Pelvic and perineal pain: Secondary | ICD-10-CM | POA: Diagnosis not present

## 2016-08-18 ENCOUNTER — Encounter: Payer: Self-pay | Admitting: Family Medicine

## 2016-08-18 ENCOUNTER — Ambulatory Visit (INDEPENDENT_AMBULATORY_CARE_PROVIDER_SITE_OTHER): Payer: BLUE CROSS/BLUE SHIELD | Admitting: Family Medicine

## 2016-08-18 VITALS — BP 110/72 | HR 64 | Ht 64.0 in

## 2016-08-18 DIAGNOSIS — M6289 Other specified disorders of muscle: Secondary | ICD-10-CM

## 2016-08-18 DIAGNOSIS — M999 Biomechanical lesion, unspecified: Secondary | ICD-10-CM

## 2016-08-18 NOTE — Progress Notes (Signed)
[name] [address] [place], [place] [address] Phone: [phone] Subjective:    CC: Low back pain f/u  [id number]  Renee [name] is a 46 y.o. female coming in with complaint of low back pain. Patient has had low back pain for some time. Also seems to be more having pelvic floor dysfunction. Patient did see her gynecologist who agreed and is being sent to integrative therapies for further evaluation and treatment. Patient states that the low back still seems to be somewhat severe. Seems to be better with activity. Doing some the home exercises in greater regular basis. No new symptoms is worsening of previous symptoms.   Past Medical History:  Diagnosis Date  . Headache   . Numbness and tingling in left arm   . Vision disturbance    Past Surgical History:  Procedure Laterality Date  . WISDOM TOOTH EXTRACTION     Social History   Social History  . Marital status: Unknown    Spouse name: N/A  . Number of children: 2  . 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 Smoking status: Never Smoker  . Smokeless tobacco: Never Used  . Alcohol use No  . Drug use: No  . Sexual activity: Not Asked   Other Topics Concern  . None   Social History Narrative   Lives at home with husband and two sons.   Right-handed.   1 cup caffeine daily.   No Known Allergies Family History  Problem Relation Age of Onset  . Diverticulitis Father   . Atrial fibrillation Mother   . Diabetes Mother   . Hypertension Mother     Past medical history, social, surgical and family history all reviewed in electronic medical record.  No pertanent information unless stated regarding to the chief complaint.   Systems examined below as of 08/18/16 General: NAD A&O x3 mood, affect normal  HEENT: Pupils equal, extraocular movements intact no nystagmus Respiratory: not short of breath  at rest or with speaking Cardiovascular: No lower extremity edema, non tender Skin: Warm dry intact with no signs of infection or rash on extremities or on axial skeleton. Abdomen: Soft nontender, no masses Neuro: Cranial nerves  intact, neurovascularly intact in all extremities with 2+ DTRs and 2+ pulses. Lymph: No lymphadenopathy appreciated today  Gait normal with good balance and coordination.  MSK: Non tender with full range of motion and good stability and symmetric strength and tone of shoulders, elbows, wrist,  knee hips and ankles bilaterally.   Objective  Blood pressure 110/72, pulse 64, height 5\' 4"  (1.626 m), SpO2 99 %.   Systems examined below as of 08/18/16 General: NAD A&O x3 mood, affect normal  HEENT: Pupils equal, extraocular movements intact no nystagmus Respiratory: not short of breath at rest or with speaking Cardiovascular: No lower extremity edema, non tender Skin: Warm dry intact with no signs of infection or rash on extremities or on axial skeleton. Abdomen: Soft nontender, no masses Neuro: Cranial nerves  intact, neurovascularly intact in all extremities with 2+ DTRs and 2+ pulses. Lymph: No lymphadenopathy appreciated today  Gait normal with good balance and coordination.  MSK: Non tender with full range of motion and good stability and symmetric strength and tone of shoulders, elbows, wrist,  knee hips and ankles bilaterally.   Back Exam:  Inspection: Unremarkable  Motion: Flexion 45 deg, Extension25 deg,  Side Bending to 35 deg bilaterally,  Rotation to 45 deg bilaterally  SLR laying: Negative  XSLR laying: Negative  Palpable tenderness: Tender to palpation of the paraspinal musculature lumbar spine right greater than left. FABER: negative. Sensory change: Gross sensation intact to all lumbar and sacral dermatomes.  Reflexes: 2+ at both patellar tendons, 2+ at achilles tendons, Babinski's downgoing.  Strength at foot  Plantar-flexion: 5/5 Dorsi-flexion:  5/5 Eversion: 5/5 Inversion: 5/5  Leg strength  Quad: 5/5 Hamstring: 5/5 Hip flexor: 5/5 Hip abductors: 4/5 but symmetric Gait unremarkable.   Osteopathic findings T3 extended rotated and side bent right inhaled third rib T6 extended rotated and side bent left L2 flexed rotated and side bent right Sacrum right on right Anterior ilium right     Impression and Recommendations:     This case required medical decision making of moderate complexity.      Note: This dictation was prepared with Dragon dictation along with smaller phrase technology. Any transcriptional errors that result from this process are unintentional.

## 2016-08-18 NOTE — Assessment & Plan Note (Signed)
I believe that this is concerning to some of the rotation of the anterior ilium. Patient will start increasing activity as tolerated. Patient will work with physical therapy. Does respond well to manipulation. Follow-up again in 6-8 weeks.

## 2016-08-18 NOTE — Patient Instructions (Signed)
Great to see you  I think we have a great plan Keep doing what you are doing and integrative therapies will be great  See me again in 6-8 weeks.

## 2016-08-18 NOTE — Assessment & Plan Note (Addendum)
Decision today to treat with OMT was based on Physical Exam  After verbal consent patient was treated with HVLA, ME, FPR techniques in  thoracic, lumbar and sacral areas  Patient tolerated the procedure well with improvement in symptoms  Patient given exercises, stretches and lifestyle modifications  See medications in patient instructions if given  Patient will follow up in  6-8 weeks 

## 2016-08-22 DIAGNOSIS — R102 Pelvic and perineal pain: Secondary | ICD-10-CM | POA: Diagnosis not present

## 2016-08-22 DIAGNOSIS — M79606 Pain in leg, unspecified: Secondary | ICD-10-CM | POA: Diagnosis not present

## 2016-08-22 DIAGNOSIS — M25552 Pain in left hip: Secondary | ICD-10-CM | POA: Diagnosis not present

## 2016-08-24 DIAGNOSIS — S2096XA Insect bite (nonvenomous) of unspecified parts of thorax, initial encounter: Secondary | ICD-10-CM | POA: Diagnosis not present

## 2016-08-25 DIAGNOSIS — M79606 Pain in leg, unspecified: Secondary | ICD-10-CM | POA: Diagnosis not present

## 2016-08-25 DIAGNOSIS — M25552 Pain in left hip: Secondary | ICD-10-CM | POA: Diagnosis not present

## 2016-08-25 DIAGNOSIS — R102 Pelvic and perineal pain: Secondary | ICD-10-CM | POA: Diagnosis not present

## 2016-09-29 DIAGNOSIS — N813 Complete uterovaginal prolapse: Secondary | ICD-10-CM | POA: Diagnosis not present

## 2016-10-09 DIAGNOSIS — R102 Pelvic and perineal pain: Secondary | ICD-10-CM | POA: Diagnosis not present

## 2016-10-09 DIAGNOSIS — M25552 Pain in left hip: Secondary | ICD-10-CM | POA: Diagnosis not present

## 2016-10-09 DIAGNOSIS — M79606 Pain in leg, unspecified: Secondary | ICD-10-CM | POA: Diagnosis not present

## 2016-10-15 ENCOUNTER — Ambulatory Visit (INDEPENDENT_AMBULATORY_CARE_PROVIDER_SITE_OTHER): Payer: BLUE CROSS/BLUE SHIELD | Admitting: Family Medicine

## 2016-10-15 ENCOUNTER — Encounter: Payer: Self-pay | Admitting: Family Medicine

## 2016-10-15 VITALS — BP 122/70 | HR 87 | Ht 64.0 in | Wt 133.0 lb

## 2016-10-15 DIAGNOSIS — M999 Biomechanical lesion, unspecified: Secondary | ICD-10-CM

## 2016-10-15 DIAGNOSIS — M6289 Other specified disorders of muscle: Secondary | ICD-10-CM

## 2016-10-15 NOTE — Progress Notes (Signed)
[name] [address] [place], [place] [address] Phone: [phone] Subjective:    CC: Low back pain f/u  [id number]  Renee [name] is a 46 y.o. female coming in with complaint of low back pain. Patient has had low back pain for some time. Also seems to be more having pelvic floor dysfunction. Patient is actually going to undergo a hysterectomy in 6 weeks. Patient feels that this is likely contributing to a lot of her discomfort and pain. Has not been working out regularly but has been doing the stretches. Feels like she is making some improvement.  Past Medical History:  Diagnosis Date  . Headache   . Numbness and tingling in left arm   . Vision disturbance    Past Surgical History:  Procedure Laterality Date  . WISDOM TOOTH EXTRACTION     Social History   Social History  . Marital status: Unknown    Spouse name: N/A  . Number of children: 2  . 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 Smoking status: Never Smoker  . Smokeless tobacco: Never Used  . Alcohol use No  . Drug use: No  . Sexual activity: Not Asked   Other Topics Concern  . None   Social History Narrative   Lives at home with husband and two sons.   Right-handed.   1 cup caffeine daily.   No Known Allergies Family History  Problem Relation Age of Onset  . Diverticulitis Father   . Atrial fibrillation Mother   . Diabetes Mother   . Hypertension Mother     Past medical history, social, surgical and family history all reviewed in electronic medical record.  No pertanent information unless stated regarding to the chief complaint.   Review of Systems: No headache, visual changes, nausea, vomiting, diarrhea, constipation, dizziness, abdominal pain, skin rash, fevers, chills, night sweats, weight loss, swollen lymph nodes, body aches, joint swelling, muscle aches, chest pain,  shortness of breath, mood changes.    Objective  Blood pressure 122/70, pulse 87, height 5\' 4"  (1.626 m), weight 133 lb (60.3 kg), SpO2 96 %.   Systems examined below as of 10/15/16 General: NAD A&O x3 mood, affect normal  HEENT: Pupils equal, extraocular movements intact no nystagmus Respiratory: not short of breath at rest or with speaking Cardiovascular: No lower extremity edema, non tender Skin: Warm dry intact with no signs of infection or rash on extremities or on axial skeleton. Abdomen: Soft nontender, no masses Neuro: Cranial nerves  intact, neurovascularly intact in all extremities with 2+ DTRs and 2+ pulses. Lymph: No lymphadenopathy appreciated today  Gait normal with good balance and coordination.  MSK: Non tender with full range of motion and good stability and symmetric strength and tone of shoulders, elbows, wrist,  knee hips and ankles bilaterally.   Back Exam:  Inspection: Unremarkable  Motion: Flexion 45 deg, Extension 25 deg, Side Bending to 45 deg bilaterally,  Rotation to 45 deg bilaterally  SLR laying: Negative  XSLR laying: Negative  Palpable tenderness: Mild tightness on the right side of the paraspinal musculature lumbar spine L5-S1. FABER: negative. Sensory change: Gross sensation intact to all lumbar and sacral dermatomes.  Reflexes: 2+ at both patellar tendons, 2+ at achilles tendons, Babinski's downgoing.  Strength at foot  Plantar-flexion: 5/5 Dorsi-flexion: 5/5 Eversion: 5/5 Inversion: 5/5  Leg strength  Quad: 5/5 Hamstring:  5/5 Hip flexor: 5/5 Hip abductors: 5/5  Gait unremarkable. .   Osteopathic findings T3 extended rotated and side bent right inhaled third rib T6 extended rotated and side bent left L2 flexed rotated and side bent right Sacrum right on right Right anterior ilium      Impression and Recommendations:     This case required medical decision making of moderate complexity.      Note: This dictation was prepared with  Dragon dictation along with smaller phrase technology. Any transcriptional errors that result from this process are unintentional.

## 2016-10-15 NOTE — Assessment & Plan Note (Signed)
Decision today to treat with OMT was based on Physical Exam  After verbal consent patient was treated with HVLA, ME, FPR techniques in  thoracic, lumbar and sacral pelvic areas  Patient tolerated the procedure well with improvement in symptoms  Patient given exercises, stretches and lifestyle modifications  See medications in patient instructions if given  Patient will follow up in 6 weeks

## 2016-10-15 NOTE — Assessment & Plan Note (Signed)
I believe the patient pelvic floor dysfunction is likely contributing to most of the discomfort and pain. I think that this is keeping her from being as active. When she has a hysterectomy had do feel that we will be able to start increasing activity in 4-6 weeks. Patient at this time will continue with the low impact exercises and only walking at the moment. Patient will follow-up with me again in 6 weeks right before her surgery.

## 2016-10-15 NOTE — Patient Instructions (Signed)
Good to see you  Alvera Singh is your friend.  Keep doing what you are doing See me again in 6 weeks

## 2016-11-13 NOTE — H&P (Signed)
46 year old G 2 P 2 with symptomatic uterine prolapse. She has tried physical therapy at integrative therapy.   Past Medical History:  Diagnosis Date  . Headache   . Numbness and tingling in left arm   . Vision disturbance    Past Surgical History:  Procedure Laterality Date  . WISDOM TOOTH EXTRACTION     Allergies NKDA  Prior to Admission medications   Not on File   Family History  Problem Relation Age of Onset  . Diverticulitis Father   . Atrial fibrillation Mother   . Diabetes Mother   . Hypertension Mother    Social History   Social History  . Marital status: Unknown    Spouse name: N/A  . Number of children: 2  . 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 Smoking status: Never Smoker  . Smokeless tobacco: Never Used  . Alcohol use No  . Drug use: No  . Sexual activity: Not on file   Other Topics Concern  . Not on file   Social History Narrative   Lives at home with husband and two sons.   Right-handed.   1 cup caffeine daily.   VSS General alert and oriented Lung CTAB Car RRR Abdomen is soft and non tender Pelvic no cytocele or rectocele Grade 3 uterine prolapse  Impression; Symptomatic Uterine Prolapse  Plan; TV[facility] Possible [facility] cul de sac culdoplasty Risks reviewed Consent signed

## 2016-11-25 DIAGNOSIS — N814 Uterovaginal prolapse, unspecified: Secondary | ICD-10-CM | POA: Diagnosis not present

## 2016-11-25 NOTE — Patient Instructions (Addendum)
Renee [name]  11/25/2016      Your procedure is scheduled on 12-02-16  Report to [facility]  at  5:30 A.M.    Call this number if you have problems the morning of surgery:  [facility].   Remember:  Do not eat food or drink liquids after midnight.  Take these medicines the morning of surgery with A SIP OF WATER: NONE   Do not wear jewelry, make-up or nail polish.  Do not wear lotions, powders, or perfumes, or deoderant.  Do not shave 48 hours prior to surgery.  Men may shave face and neck.  Do not bring valuables to the hospital.  [facility] is not responsible for any belongings or valuables.  Contacts, dentures or bridgework may not be worn into surgery. .  Patients discharged the day of surgery will not be allowed to drive home.   Special instructions:  none  Please read over the following fact sheets that you were given:      [facility] - Preparing for Surgery Before surgery, you can play an important role.  Because skin is not sterile, your skin needs to be as free of germs as possible.  You can reduce the number of germs on your skin by washing with CHG (chlorahexidine gluconate) soap before surgery.  CHG is an antiseptic cleaner which kills germs and bonds with the skin to continue killing germs even after washing. Please DO NOT use if you have an allergy to CHG or antibacterial soaps.  If your skin becomes reddened/irritated stop using the CHG and inform your nurse when you arrive at Short Stay. Do not shave (including legs and underarms) for at least 48 hours prior to the first CHG shower.  You may shave your face/neck. Please follow these instructions carefully:  1.  Shower with CHG Soap the night before surgery and the  morning of Surgery.  2.  If you choose to wash your hair, wash your hair first as usual with your  normal  shampoo.  3.  After you  shampoo, rinse your hair and body thoroughly to remove the  shampoo.                           4.  Use CHG as you would any other liquid soap.  You can apply chg directly  to the skin and wash                       Gently with a scrungie or clean washcloth.  5.  Apply the CHG Soap to your body ONLY FROM THE NECK DOWN.   Do not use on face/ open                           Wound or open sores. Avoid contact with eyes, ears mouth and genitals (private parts).                       Wash face,  Genitals (private parts) with your normal soap.             6.  Wash thoroughly, paying special attention to the area where your surgery  will be performed.  7.  Thoroughly rinse your body with warm water  from the neck down.  8.  DO NOT shower/wash with your normal soap after using and rinsing off  the CHG Soap.                9.  Pat yourself dry with a clean towel.            10.  Wear clean pajamas.            11.  Place clean sheets on your bed the night of your first shower and do not  sleep with pets. Day of Surgery : Do not apply any lotions/deodorants the morning of surgery.  Please wear clean clothes to the hospital/surgery center.  FAILURE TO FOLLOW THESE INSTRUCTIONS MAY RESULT IN THE CANCELLATION OF YOUR SURGERY PATIENT SIGNATURE_________________________________  NURSE SIGNATURE__________________________________  ________________________________________________________________________

## 2016-11-26 ENCOUNTER — Encounter (HOSPITAL_COMMUNITY)
Admission: RE | Admit: 2016-11-26 | Discharge: 2016-11-26 | Disposition: A | Discharge status: Home / Self Care | Payer: BLUE CROSS/BLUE SHIELD | Source: Ambulatory Visit | Attending: Obstetrics and Gynecology | Admitting: Obstetrics and Gynecology

## 2016-11-26 ENCOUNTER — Ambulatory Visit (INDEPENDENT_AMBULATORY_CARE_PROVIDER_SITE_OTHER): Payer: BLUE CROSS/BLUE SHIELD | Admitting: Family Medicine

## 2016-11-26 ENCOUNTER — Encounter (HOSPITAL_COMMUNITY): Payer: Self-pay

## 2016-11-26 DIAGNOSIS — Z01818 Encounter for other preprocedural examination: Secondary | ICD-10-CM | POA: Diagnosis not present

## 2016-11-26 DIAGNOSIS — N814 Uterovaginal prolapse, unspecified: Secondary | ICD-10-CM | POA: Insufficient documentation

## 2016-11-26 HISTORY — DX: Radiculopathy, lumbar region: M54.16

## 2016-11-26 LAB — CBC
HEMATOCRIT: 40 % (ref 36.0–46.0)
HEMOGLOBIN: 13.7 g/dL (ref 12.0–15.0)
MCH: 30.2 pg (ref 26.0–34.0)
MCHC: 34.3 g/dL (ref 30.0–36.0)
MCV: 88.3 fL (ref 78.0–100.0)
Platelets: 319 10*3/uL (ref 150–400)
RBC: 4.53 MIL/uL (ref 3.87–5.11)
RDW: 12.7 % (ref 11.5–15.5)
WBC: 11.2 10*3/uL — ABNORMAL HIGH (ref 4.0–10.5)

## 2016-11-26 LAB — ABO/RH: ABO/RH(D): O POS

## 2016-11-26 LAB — PREGNANCY, URINE: Preg Test, Ur: NEGATIVE

## 2016-11-26 NOTE — Progress Notes (Signed)
  [name] [address] [place], [place] [address] Phone: [phone] Subjective:    I'm seeing this patient by the request  of:    CC:   [id number]  Renee [name] is a 46 y.o. female coming in with complaint of   Onset-  Location Duration-  Character- Aggravating factors- Reliving factors-  Therapies tried-  Severity-     Past Medical History:  Diagnosis Date  . Headache   . Numbness and tingling in left arm   . Vision disturbance    Past Surgical History:  Procedure Laterality Date  . WISDOM TOOTH EXTRACTION     Social History   Social History  . Marital status: Married    Spouse name: N/A  . Number of children: 2  . 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 Smoking status: Never Smoker  . Smokeless tobacco: Never Used  . Alcohol use No  . Drug use: No  . Sexual activity: Not on file   Other Topics Concern  . Not on file   Social History Narrative   Lives at home with husband and two sons.   Right-handed.   1 cup caffeine daily.   No Known Allergies Family History  Problem Relation Age of Onset  . Diverticulitis Father   . Atrial fibrillation Mother   . Diabetes Mother   . Hypertension Mother      Past medical history, social, surgical and family history all reviewed in electronic medical record.  No pertanent information unless stated regarding to the chief complaint.   Review of Systems:Review of systems updated and as accurate as of 11/26/16  No headache, visual changes, nausea, vomiting, diarrhea, constipation, dizziness, abdominal pain, skin rash, fevers, chills, night sweats, weight loss, swollen lymph nodes, body aches, joint swelling, muscle aches, chest pain, shortness of breath, mood changes.   Objective  There were no vitals taken for this visit. Systems examined below as of 11/26/16   General: No apparent  distress alert and oriented x3 mood and affect normal, dressed appropriately.  HEENT: Pupils equal, extraocular movements intact  Respiratory: Patient's speak in full sentences and does not appear short of breath  Cardiovascular: No lower extremity edema, non tender, no erythema  Skin: Warm dry intact with no signs of infection or rash on extremities or on axial skeleton.  Abdomen: Soft nontender  Neuro: Cranial nerves II through XII are intact, neurovascularly intact in all extremities with 2+ DTRs and 2+ pulses.  Lymph: No lymphadenopathy of posterior or anterior cervical chain or axillae bilaterally.  Gait normal with good balance and coordination.  MSK:  Non tender with full range of motion and good stability and symmetric strength and tone of shoulders, elbows, wrist, hip, knee and ankles bilaterally.     Impression and Recommendations:     This case required medical decision making of moderate complexity.      Note: This dictation was prepared with Dragon dictation along with smaller phrase technology. Any transcriptional errors that result from this process are unintentional.

## 2016-11-27 ENCOUNTER — Encounter: Payer: Self-pay | Admitting: Family Medicine

## 2016-11-27 ENCOUNTER — Ambulatory Visit (INDEPENDENT_AMBULATORY_CARE_PROVIDER_SITE_OTHER): Payer: BLUE CROSS/BLUE SHIELD | Admitting: Family Medicine

## 2016-11-27 VITALS — BP 118/82 | HR 77 | Ht 64.0 in | Wt 134.0 lb

## 2016-11-27 DIAGNOSIS — M6289 Other specified disorders of muscle: Secondary | ICD-10-CM | POA: Diagnosis not present

## 2016-11-27 DIAGNOSIS — M999 Biomechanical lesion, unspecified: Secondary | ICD-10-CM

## 2016-11-27 NOTE — Progress Notes (Signed)
[name] [address] [place], [place] [address] Phone: [phone] Subjective:    I'm seeing this patient by the request  of:    CC: Back pain follow-up  [id number]  Renee [name] is a 46 y.o. female coming in with complaint of back pain follow-up. Patient has been seen multiple times. Has been doing very well with home exercises. Patient does have pelvic floor dysfunction and is going to be having a hysterectomy in the near future. Patient is a little concerned about. Still some mild discomfort with back pain and other things. Feels like she is doing relatively well. Some stiffness. Not worse than his been previously.      Past Medical History:  Diagnosis Date  . Headache   . Lumbar radiculitis   . Numbness and tingling in left arm    due to overdoing in in the gym with exercising   . Vision disturbance    Past Surgical History:  Procedure Laterality Date  . WISDOM TOOTH EXTRACTION     Social History   Social History  . Marital status: Married    Spouse name: N/A  . Number of children: 2  . 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 Smoking status: Never Smoker  . Smokeless tobacco: Never Used  . Alcohol use No  . Drug use: No  . Sexual activity: Not Asked   Other Topics Concern  . None   Social History Narrative   Lives at home with husband and two sons.   Right-handed.   1 cup caffeine daily.   No Known Allergies Family History  Problem Relation Age of Onset  . Diverticulitis Father   . Atrial fibrillation Mother   . Diabetes Mother   . Hypertension Mother      Past medical history, social, surgical and family history all reviewed in electronic medical record.  No pertanent information unless stated regarding to the chief complaint.   Review of Systems:Review of systems updated and as accurate as of 11/27/16  No headache,  visual changes, nausea, vomiting, diarrhea, constipation, dizziness, abdominal pain, skin rash, fevers, chills, night sweats, weight loss, swollen lymph nodes, body aches, joint swelling, muscle aches, chest pain, shortness of breath, mood changes.   Objective  Blood pressure 118/82, pulse 77, height 5\' 4"  (1.626 m), weight 134 lb (60.8 kg), last menstrual period 11/20/2016, SpO2 98 %. Systems examined below as of 11/27/16   General: No apparent distress alert and oriented x3 mood and affect normal, dressed appropriately.  HEENT: Pupils equal, extraocular movements intact  Respiratory: Patient's speak in full sentences and does not appear short of breath  Cardiovascular: No lower extremity edema, non tender, no erythema  Skin: Warm dry intact with no signs of infection or rash on extremities or on axial skeleton.  Abdomen: Soft nontender  Neuro: Cranial nerves II through XII are intact, neurovascularly intact in all extremities with 2+ DTRs and 2+ pulses.  Lymph: No lymphadenopathy of posterior or anterior cervical chain or axillae bilaterally.  Gait normal with good balance and coordination.  MSK:  Non tender with full range of motion and good stability and symmetric strength and tone of shoulders, elbows, wrist, hip, knee and ankles bilaterally.  Back Exam:  Inspection: Mild loss of lordosis Motion: Flexion 45 deg, Extension 25 deg, Side Bending to 35 deg bilaterally,  Rotation to 35 deg  bilaterally  SLR laying: Negative  XSLR laying: Negative  Palpable tenderness: Tender to palpation in the paraspinal musculature of the lumbar spine left couldn't and right. FABER: negative. Sensory change: Gross sensation intact to all lumbar and sacral dermatomes.  Reflexes: 2+ at both patellar tendons, 2+ at achilles tendons, Babinski's downgoing.  Strength at foot  Plantar-flexion: 5/5 Dorsi-flexion: 5/5 Eversion: 5/5 Inversion: 5/5  Leg strength  Quad: 5/5 Hamstring: 5/5 Hip flexor: 5/5 Hip  abductors: 5/5  Gait unremarkable.  Osteopathic findings T4 extended rotated and side bent left inhaled rib T9 extended rotated and side bent left L3 flexed rotated and side bent right Sacrum right on right Pelvic shear noted    Impression and Recommendations:     This case required medical decision making of moderate complexity.      Note: This dictation was prepared with Dragon dictation along with smaller phrase technology. Any transcriptional errors that result from this process are unintentional.

## 2016-11-27 NOTE — Assessment & Plan Note (Signed)
Patient is undergoing a hysterectomy nothing that this could also improve potentially the pelvic floor dysfunction. Discussed with patient at great length. We discussed the possible physical therapy could be related to this. Continue with the osteopathic manipulation. Patient will see me again in 6 weeks for further evaluation and treatment.

## 2016-11-27 NOTE — Assessment & Plan Note (Addendum)
Decision today to treat with OMT was based on Physical Exam  After verbal consent patient was treated with HVLA, ME, FPR techniques in  thoracic, lumbar and sacral areas  Patient tolerated the procedure well with improvement in symptoms  Patient given exercises, stretches and lifestyle modifications  See medications in patient instructions if given  Patient will follow up in  6-8 weeks 

## 2016-11-27 NOTE — Patient Instructions (Signed)
Good to see you  Once again I am sorry for yesterday  You will do great  If you need anything [phone] See you when I see you

## 2016-12-02 ENCOUNTER — Ambulatory Visit (HOSPITAL_BASED_OUTPATIENT_CLINIC_OR_DEPARTMENT_OTHER)
Admission: RE | Admit: 2016-12-02 | Discharge: 2016-12-03 | Disposition: A | Discharge status: Home / Self Care | Payer: BLUE CROSS/BLUE SHIELD | Source: Ambulatory Visit | Attending: Obstetrics and Gynecology | Admitting: Obstetrics and Gynecology

## 2016-12-02 ENCOUNTER — Encounter (HOSPITAL_BASED_OUTPATIENT_CLINIC_OR_DEPARTMENT_OTHER): Payer: Self-pay

## 2016-12-02 ENCOUNTER — Ambulatory Visit (HOSPITAL_BASED_OUTPATIENT_CLINIC_OR_DEPARTMENT_OTHER): Payer: BLUE CROSS/BLUE SHIELD | Admitting: Anesthesiology

## 2016-12-02 ENCOUNTER — Encounter (HOSPITAL_BASED_OUTPATIENT_CLINIC_OR_DEPARTMENT_OTHER)
Admission: RE | Disposition: A | Discharge status: Home / Self Care | Payer: Self-pay | Source: Ambulatory Visit | Attending: Obstetrics and Gynecology

## 2016-12-02 DIAGNOSIS — Z833 Family history of diabetes mellitus: Secondary | ICD-10-CM | POA: Diagnosis not present

## 2016-12-02 DIAGNOSIS — D259 Leiomyoma of uterus, unspecified: Secondary | ICD-10-CM | POA: Diagnosis not present

## 2016-12-02 DIAGNOSIS — N888 Other specified noninflammatory disorders of cervix uteri: Secondary | ICD-10-CM | POA: Diagnosis not present

## 2016-12-02 DIAGNOSIS — N814 Uterovaginal prolapse, unspecified: Secondary | ICD-10-CM | POA: Diagnosis not present

## 2016-12-02 DIAGNOSIS — Z8379 Family history of other diseases of the digestive system: Secondary | ICD-10-CM | POA: Diagnosis not present

## 2016-12-02 DIAGNOSIS — R51 Headache: Secondary | ICD-10-CM | POA: Insufficient documentation

## 2016-12-02 DIAGNOSIS — Z8249 Family history of ischemic heart disease and other diseases of the circulatory system: Secondary | ICD-10-CM | POA: Insufficient documentation

## 2016-12-02 DIAGNOSIS — N813 Complete uterovaginal prolapse: Secondary | ICD-10-CM | POA: Insufficient documentation

## 2016-12-02 DIAGNOSIS — G43009 Migraine without aura, not intractable, without status migrainosus: Secondary | ICD-10-CM | POA: Diagnosis not present

## 2016-12-02 DIAGNOSIS — M5416 Radiculopathy, lumbar region: Secondary | ICD-10-CM | POA: Diagnosis not present

## 2016-12-02 HISTORY — PX: VAGINAL HYSTERECTOMY: SHX2639

## 2016-12-02 LAB — TYPE AND SCREEN
ABO/RH(D): O POS
ANTIBODY SCREEN: NEGATIVE

## 2016-12-02 SURGERY — HYSTERECTOMY, VAGINAL
Anesthesia: General | Site: Vagina

## 2016-12-02 MED ORDER — KETOROLAC TROMETHAMINE 30 MG/ML IJ SOLN
30.0000 mg | Freq: Once | INTRAMUSCULAR | Status: DC
  Filled 2016-12-02: qty 1

## 2016-12-02 MED ORDER — CELECOXIB 200 MG PO CAPS
ORAL_CAPSULE | ORAL | Status: AC
  Filled 2016-12-02: qty 2

## 2016-12-02 MED ORDER — SUGAMMADEX SODIUM 200 MG/2ML IV SOLN
INTRAVENOUS | Status: AC
  Filled 2016-12-02: qty 2

## 2016-12-02 MED ORDER — ROCURONIUM BROMIDE 10 MG/ML (PF) SYRINGE
PREFILLED_SYRINGE | INTRAVENOUS | Status: DC | PRN
  Administered 2016-12-02: 50 mg via INTRAVENOUS

## 2016-12-02 MED ORDER — MIDAZOLAM HCL 2 MG/2ML IJ SOLN
INTRAMUSCULAR | Status: AC
  Filled 2016-12-02: qty 2

## 2016-12-02 MED ORDER — FENTANYL CITRATE (PF) 100 MCG/2ML IJ SOLN
INTRAMUSCULAR | Status: DC | PRN
  Administered 2016-12-02: 100 ug via INTRAVENOUS
  Administered 2016-12-02: 50 ug via INTRAVENOUS

## 2016-12-02 MED ORDER — SUGAMMADEX SODIUM 200 MG/2ML IV SOLN
INTRAVENOUS | Status: DC | PRN
  Administered 2016-12-02: 200 mg via INTRAVENOUS

## 2016-12-02 MED ORDER — ONDANSETRON HCL 4 MG/2ML IJ SOLN
INTRAMUSCULAR | Status: AC
  Filled 2016-12-02: qty 2

## 2016-12-02 MED ORDER — FENTANYL CITRATE (PF) 100 MCG/2ML IJ SOLN
25.0000 ug | INTRAMUSCULAR | Status: DC | PRN
  Administered 2016-12-02 (×2): 25 ug via INTRAVENOUS
  Filled 2016-12-02: qty 1

## 2016-12-02 MED ORDER — MIDAZOLAM HCL 2 MG/2ML IJ SOLN
INTRAMUSCULAR | Status: DC | PRN
  Administered 2016-12-02: 2 mg via INTRAVENOUS

## 2016-12-02 MED ORDER — GABAPENTIN 300 MG PO CAPS
ORAL_CAPSULE | ORAL | Status: AC
  Filled 2016-12-02: qty 1

## 2016-12-02 MED ORDER — ACETAMINOPHEN 500 MG PO TABS
ORAL_TABLET | ORAL | Status: AC
  Filled 2016-12-02: qty 2

## 2016-12-02 MED ORDER — DEXAMETHASONE SODIUM PHOSPHATE 10 MG/ML IJ SOLN
INTRAMUSCULAR | Status: DC | PRN
  Administered 2016-12-02: 10 mg via INTRAVENOUS

## 2016-12-02 MED ORDER — IBUPROFEN 600 MG PO TABS
600.0000 mg | ORAL_TABLET | Freq: Four times a day (QID) | ORAL | Status: DC | PRN
  Filled 2016-12-02: qty 1

## 2016-12-02 MED ORDER — ROCURONIUM BROMIDE 50 MG/5ML IV SOSY
PREFILLED_SYRINGE | INTRAVENOUS | Status: AC
  Filled 2016-12-02: qty 5

## 2016-12-02 MED ORDER — PROPOFOL 10 MG/ML IV BOLUS
INTRAVENOUS | Status: DC | PRN
  Administered 2016-12-02: 150 mg via INTRAVENOUS

## 2016-12-02 MED ORDER — FENTANYL CITRATE (PF) 100 MCG/2ML IJ SOLN
INTRAMUSCULAR | Status: AC
  Filled 2016-12-02: qty 2

## 2016-12-02 MED ORDER — SODIUM CHLORIDE 0.9 % IJ SOLN
INTRAMUSCULAR | Status: AC
  Filled 2016-12-02: qty 10

## 2016-12-02 MED ORDER — CELECOXIB 400 MG PO CAPS
400.0000 mg | ORAL_CAPSULE | Freq: Once | ORAL | Status: AC
  Administered 2016-12-02: 400 mg via ORAL
  Filled 2016-12-02: qty 1

## 2016-12-02 MED ORDER — LACTATED RINGERS IV SOLN
INTRAVENOUS | Status: DC
  Administered 2016-12-02: 06:00:00 via INTRAVENOUS
  Filled 2016-12-02: qty 1000

## 2016-12-02 MED ORDER — LACTATED RINGERS IV SOLN
INTRAVENOUS | Status: DC
  Filled 2016-12-02: qty 1000

## 2016-12-02 MED ORDER — KETOROLAC TROMETHAMINE 30 MG/ML IJ SOLN
INTRAMUSCULAR | Status: AC
  Filled 2016-12-02: qty 1

## 2016-12-02 MED ORDER — ACETAMINOPHEN 500 MG PO TABS
1000.0000 mg | ORAL_TABLET | Freq: Once | ORAL | Status: AC
  Administered 2016-12-02: 1000 mg via ORAL
  Filled 2016-12-02: qty 2

## 2016-12-02 MED ORDER — LIDOCAINE 2% (20 MG/ML) 5 ML SYRINGE
INTRAMUSCULAR | Status: DC | PRN
  Administered 2016-12-02: 80 mg via INTRAVENOUS

## 2016-12-02 MED ORDER — LIDOCAINE 2% (20 MG/ML) 5 ML SYRINGE
INTRAMUSCULAR | Status: AC
  Filled 2016-12-02: qty 5

## 2016-12-02 MED ORDER — ARTIFICIAL TEARS OPHTHALMIC OINT
TOPICAL_OINTMENT | OPHTHALMIC | Status: AC
  Filled 2016-12-02: qty 3.5

## 2016-12-02 MED ORDER — SCOPOLAMINE 1 MG/3DAYS TD PT72
MEDICATED_PATCH | TRANSDERMAL | Status: AC
  Filled 2016-12-02: qty 1

## 2016-12-02 MED ORDER — PROPOFOL 10 MG/ML IV BOLUS
INTRAVENOUS | Status: AC
  Filled 2016-12-02: qty 40

## 2016-12-02 MED ORDER — DEXAMETHASONE SODIUM PHOSPHATE 10 MG/ML IJ SOLN
INTRAMUSCULAR | Status: AC
  Filled 2016-12-02: qty 1

## 2016-12-02 MED ORDER — PROMETHAZINE HCL 25 MG/ML IJ SOLN
6.2500 mg | INTRAMUSCULAR | Status: DC | PRN
  Administered 2016-12-02: 6.25 mg via INTRAVENOUS
  Filled 2016-12-02: qty 1

## 2016-12-02 MED ORDER — PROMETHAZINE HCL 25 MG/ML IJ SOLN
INTRAMUSCULAR | Status: AC
  Filled 2016-12-02: qty 1

## 2016-12-02 MED ORDER — CEFOTETAN DISODIUM-DEXTROSE 2-2.08 GM-%(50ML) IV SOLR
INTRAVENOUS | Status: AC
  Filled 2016-12-02: qty 50

## 2016-12-02 MED ORDER — DEXTROSE 5 % IV SOLN
2.0000 g | INTRAVENOUS | Status: AC
  Administered 2016-12-02: 2 g via INTRAVENOUS
  Filled 2016-12-02: qty 2

## 2016-12-02 MED ORDER — SCOPOLAMINE 1 MG/3DAYS TD PT72
1.0000 | MEDICATED_PATCH | Freq: Once | TRANSDERMAL | Status: AC
  Administered 2016-12-02: 1 via TRANSDERMAL
  Administered 2016-12-02: 1.5 mg via TRANSDERMAL
  Filled 2016-12-02: qty 1

## 2016-12-02 MED ORDER — FENTANYL CITRATE (PF) 250 MCG/5ML IJ SOLN
INTRAMUSCULAR | Status: AC
  Filled 2016-12-02: qty 5

## 2016-12-02 MED ORDER — ONDANSETRON HCL 4 MG/2ML IJ SOLN
INTRAMUSCULAR | Status: DC | PRN
  Administered 2016-12-02: 4 mg via INTRAVENOUS

## 2016-12-02 MED ORDER — TRAMADOL HCL 50 MG PO TABS
50.0000 mg | ORAL_TABLET | Freq: Four times a day (QID) | ORAL | Status: DC | PRN
  Filled 2016-12-02: qty 1

## 2016-12-02 MED ORDER — MENTHOL 3 MG MT LOZG
1.0000 | LOZENGE | OROMUCOSAL | Status: DC | PRN
  Filled 2016-12-02: qty 9

## 2016-12-02 MED ORDER — GABAPENTIN 300 MG PO CAPS
300.0000 mg | ORAL_CAPSULE | Freq: Once | ORAL | Status: AC
  Administered 2016-12-02: 300 mg via ORAL
  Filled 2016-12-02: qty 1

## 2016-12-02 SURGICAL SUPPLY — 37 items
BAG URINE DRAINAGE (UROLOGICAL SUPPLIES) IMPLANT
BLADE CLIPPER SURG (BLADE) IMPLANT
CANISTER SUCT 3000ML PPV (MISCELLANEOUS) IMPLANT
CATH FOLEY 2WAY SLVR  5CC 14FR (CATHETERS) ×2
CATH FOLEY 2WAY SLVR 5CC 14FR (CATHETERS) ×1 IMPLANT
CLOTH BEACON ORANGE TIMEOUT ST (SAFETY) ×3 IMPLANT
DURAPREP 26ML APPLICATOR (WOUND CARE) IMPLANT
GLOVE BIO SURGEON STRL SZ 6.5 (GLOVE) ×8 IMPLANT
GLOVE BIO SURGEON STRL SZ7 (GLOVE) ×9 IMPLANT
GLOVE BIO SURGEONS STRL SZ 6.5 (GLOVE) ×4
GLOVE BIOGEL PI IND STRL 6.5 (GLOVE) ×1 IMPLANT
GLOVE BIOGEL PI IND STRL 7.5 (GLOVE) ×1 IMPLANT
GLOVE BIOGEL PI INDICATOR 6.5 (GLOVE) ×2
GLOVE BIOGEL PI INDICATOR 7.5 (GLOVE) ×2
GOWN STRL REUS W/ TWL LRG LVL3 (GOWN DISPOSABLE) IMPLANT
GOWN STRL REUS W/TWL LRG LVL3 (GOWN DISPOSABLE) ×3 IMPLANT
HOLDER FOLEY CATH W/STRAP (MISCELLANEOUS) ×3 IMPLANT
KIT RM TURNOVER CYSTO AR (KITS) ×3 IMPLANT
NEEDLE HYPO 22GX1.5 SAFETY (NEEDLE) IMPLANT
NS IRRIG 500ML POUR BTL (IV SOLUTION) ×3 IMPLANT
PACK TRENDGUARD 450 HYBRID PRO (MISCELLANEOUS) ×1 IMPLANT
PACK VAGINAL WOMENS (CUSTOM PROCEDURE TRAY) ×3 IMPLANT
PAD OB MATERNITY 4.3X12.25 (PERSONAL CARE ITEMS) ×3 IMPLANT
SPONGE LAP 4X18 X RAY DECT (DISPOSABLE) IMPLANT
SUT VIC AB 0 CT1 18XCR BRD 8 (SUTURE) ×2 IMPLANT
SUT VIC AB 0 CT1 36 (SUTURE) ×9 IMPLANT
SUT VIC AB 0 CT1 8-18 (SUTURE) ×4
SUT VIC AB 3-0 SH 27 (SUTURE)
SUT VIC AB 3-0 SH 27X BRD (SUTURE) IMPLANT
SUT VICRYL 0 TIES 12 18 (SUTURE) ×3 IMPLANT
SUT VICRYL 0 UR6 27IN ABS (SUTURE) ×3 IMPLANT
SYR BULB IRRIGATION 50ML (SYRINGE) ×3 IMPLANT
SYRINGE 10CC LL (SYRINGE) ×3 IMPLANT
TOWEL OR 17X24 6PK STRL BLUE (TOWEL DISPOSABLE) ×6 IMPLANT
TRAY FOLEY CATH SILVER 14FR (SET/KITS/TRAYS/PACK) ×3 IMPLANT
TRENDGUARD 450 HYBRID PRO PACK (MISCELLANEOUS) ×3
WATER STERILE IRR 500ML POUR (IV SOLUTION) IMPLANT

## 2016-12-02 NOTE — Progress Notes (Signed)
Patient comfortable Eating cheerios BP 131/75   Pulse (!) 54   Temp 97.8 F (36.6 C)   Resp 12   Ht 5\' 4"  (1.626 m)   Wt 60 kg (132 lb 5 oz)   LMP 11/20/2016 (Approximate)   SpO2 100%   BMI 22.71 kg/m  Urine output great Abdomen is soft and flat No vaginal bleeding  POD # 0  Doing great Remove foley Ambulate Advance diet

## 2016-12-02 NOTE — Anesthesia Postprocedure Evaluation (Signed)
Anesthesia Post Note  Patient: Maniya Hansmann  Procedure(s) Performed: HYSTERECTOMY VAGINAL WITH BILATERAL SALPINGECTOMY (N/A Vagina )     Patient location during evaluation: PACU Anesthesia Type: General Level of consciousness: awake and alert Pain management: pain level controlled Vital Signs Assessment: post-procedure vital signs reviewed and stable Respiratory status: spontaneous breathing, nonlabored ventilation and respiratory function stable Cardiovascular status: blood pressure returned to baseline and stable Postop Assessment: no apparent nausea or vomiting Anesthetic complications: no    Last Vitals:  Vitals:   12/02/16 1045 12/02/16 1100  BP: 139/78 131/75  Pulse: (!) 54 (!) 54  Resp: (!) 21 12  Temp:  36.6 C  SpO2: 100% 100%    Last Pain:  Vitals:   12/02/16 1124  TempSrc:   PainSc: Asleep                 Birtha Hatler A.

## 2016-12-02 NOTE — Progress Notes (Signed)
H and p on the chart No changes Will proceed with [facility] Consent signed

## 2016-12-02 NOTE — Transfer of Care (Signed)
Immediate Anesthesia Transfer of Care Note  Patient: Renee [name]  Procedure(s) Performed: HYSTERECTOMY VAGINAL WITH BILATERAL SALPINGECTOMY (N/A Vagina )  Patient Location: PACU  Anesthesia Type:General  Level of Consciousness: awake, alert , oriented and patient cooperative  Airway & Oxygen Therapy: Patient Spontanous Breathing and Patient connected to nasal cannula oxygen  Post-op Assessment: Report given to RN and Post -op Vital signs reviewed and stable  Post vital signs: Reviewed and stable  Last Vitals:  Vitals:   12/02/16 0528  BP: 130/76  Pulse: 80  Temp: 36.6 C  SpO2: 100%    Last Pain:  Vitals:   12/02/16 0528  TempSrc: Oral      Patients Stated Pain Goal: 5 (65/79/03 8333)  Complications: No apparent anesthesia complications

## 2016-12-02 NOTE — Anesthesia Procedure Notes (Signed)
Procedure Name: Intubation Date/Time: 12/02/2016 7:30 AM Performed by: Wanita Chamberlain Pre-anesthesia Checklist: Patient identified, Emergency Drugs available, Suction available and Patient being monitored Patient Re-evaluated:Patient Re-evaluated prior to induction Oxygen Delivery Method: Circle system utilized Preoxygenation: Pre-oxygenation with 100% oxygen Induction Type: IV induction Ventilation: Mask ventilation without difficulty Laryngoscope Size: Mac and 3 Grade View: Grade I Tube type: Oral Tube size: 7.0 mm Number of attempts: 1 Airway Equipment and Method: Stylet Placement Confirmation: ETT inserted through vocal cords under direct vision,  positive ETCO2 and breath sounds checked- equal and bilateral Secured at: 21 cm Tube secured with: Tape Dental Injury: Teeth and Oropharynx as per pre-operative assessment

## 2016-12-02 NOTE — Anesthesia Preprocedure Evaluation (Addendum)
Anesthesia Evaluation  Patient identified by MRN, date of birth, ID band Patient awake    Reviewed: Allergy & Precautions, NPO status , Patient's Chart, lab work & pertinent test results  Airway Mallampati: II  TM Distance: >3 FB Neck ROM: Full    Dental  (+) Teeth Intact, Dental Advisory Given   Pulmonary neg pulmonary ROS,    Pulmonary exam normal breath sounds clear to auscultation       Cardiovascular negative cardio ROS Normal cardiovascular exam Rhythm:Regular Rate:Normal     Neuro/Psych  Headaches,    GI/Hepatic negative GI ROS, Neg liver ROS,   Endo/Other  negative endocrine ROS  Renal/GU negative Renal ROS     Musculoskeletal negative musculoskeletal ROS (+)   Abdominal   Peds  Hematology negative hematology ROS (+)   Anesthesia Other Findings Day of surgery medications reviewed with the patient.  Reproductive/Obstetrics Uterine prolapse                             Anesthesia Physical Anesthesia Plan  ASA: I  Anesthesia Plan: General   Post-op Pain Management:    Induction: Intravenous  PONV Risk Score and Plan: 4 or greater and Ondansetron, Dexamethasone, Midazolam, Scopolamine patch - Pre-op and Promethazine  Airway Management Planned: Oral ETT  Additional Equipment:   Intra-op Plan:   Post-operative Plan: Extubation in OR  Informed Consent: I have reviewed the patients History and Physical, chart, labs and discussed the procedure including the risks, benefits and alternatives for the proposed anesthesia with the patient or authorized representative who has indicated his/her understanding and acceptance.   Dental advisory given  Plan Discussed with: CRNA  Anesthesia Plan Comments: (Risks/benefits of general anesthesia discussed with patient including risk of damage to teeth, lips, gum, and tongue, nausea/vomiting, allergic reactions to medications, and the  possibility of heart attack, stroke and death.  All patient questions answered.  Patient wishes to proceed.)        Anesthesia Quick Evaluation

## 2016-12-02 NOTE — Op Note (Signed)
NAMEJANAT, Renee [name]                   ACCOUNT NO.:  [id number]  MEDICAL RECORD NO.:  [id number]  LOCATION:                                 FACILITY:  PHYSICIAN:  Keatyn Jawad L. Helane Rima, M.D.    DATE OF BIRTH:  DATE OF PROCEDURE:  12/02/2016 DATE OF DISCHARGE:                              OPERATIVE REPORT   PREOPERATIVE DIAGNOSIS:  Uterine prolapse.  POSTOPERATIVE DIAGNOSIS:  Uterine prolapse.  PROCEDURE:  Total vaginal hysterectomy with bilateral salpingectomy.  SURGEON:  Ervine Witucki L. Helane Rima, M.D.  ASSISTANT:  Dr. Ileene Rubens.  ANESTHESIA:  General.  ESTIMATED BLOOD LOSS:  225 mL.  DRAINS:  Foley catheter.  DESCRIPTION OF PROCEDURE:  The patient was taken to the operating room after informed consent was obtained.  She was then prepped and draped in the usual sterile fashion.  A Foley catheter was inserted.  Exam under anesthesia revealed grade 3 uterine prolapse.  No cystocele or rectocele sign.  The speculum was inserted into the vagina.  The cervix was grasped with a tenaculum and I could completely exteriorize the cervix. A circumferential incision was made around the cervix.  The posterior cul-de-sac was entered using Mayo scissors.  A weighted speculum was inserted into the vagina.  Metzenbaum scissors were used to enter the anterior cul-de-sac.  The uterosacral ligaments were clamped on either side.  Each pedicle clamped, cut, and suture ligated using 0 Vicryl suture. We walked our way up the broad ligament staying snug beside the cervix and the uterus.  Each pedicle clamped, cut, and suture ligated using 0 Vicryl suture.  The uterus is retroflexed and the triple pedicle was clamped on either side using a curved Haney clamp.  The pedicles were secured using suture ligature and a free tie of 0 Vicryl suture. After these pedicles were double tied, the specimen had been removed and was identified as cervix and uterine.  We then inspected the ovaries and ovaries appeared to be  normal.  At this point, I used a Babcock clamp to gently tease each fallopian tube down and each fallopian tube segment was removed using curved Haney clamps and each pedicle was tied using 0 Vicryl suture.  We then inspected all pedicles.  Hemostasis was very good.  I then placed a McCall cul-de-sac stitch to help with prevention of an enterocele later on and this was done with 0 Vicryl suture.  We then closed the posterior cuff in a running lock stitch for hemostasis using 0 Vicryl suture in a running locked fashion.  We then closed the cuff completely anterior to posterior using a running lock stitch using 0 Vicryl suture.  All sponge, lap, and instrument counts were correct x2.  Urine output was clear and a good amount.  Her EBL was 225 mL.  The patient was extubated and went to recovery room in stable condition.     [name], M.D.     [name]  D:  12/02/2016  T:  12/02/2016  Job:  [id number]

## 2016-12-02 NOTE — Brief Op Note (Signed)
12/02/2016  8:36 AM  PATIENT:  Pervis Hocking  46 y.o. female  PRE-OPERATIVE DIAGNOSIS:  UTERINE PROLAPSE  POST-OPERATIVE DIAGNOSIS:  UTERINE PROLAPSE  PROCEDURE:  Procedure(s): HYSTERECTOMY VAGINAL WITH BILATERAL SALPINGECTOMY (N/A)  SURGEON:  Surgeon(s) and Role:    * Dian Queen, MD - Primary    * Morris, Megan, DO - Assisting  PHYSICIAN ASSISTANT:   ASSISTANTS: none   ANESTHESIA:   general  EBL:  225 mL   BLOOD ADMINISTERED:none  DRAINS: Urinary Catheter (Foley)   LOCAL MEDICATIONS USED:  NONE  SPECIMEN:  Source of Specimen:  cervix, uterus and fallopian tubes  DISPOSITION OF SPECIMEN:  PATHOLOGY  COUNTS:  YES  TOURNIQUET:  * No tourniquets in log *  DICTATION: .Other Dictation: Dictation Number [id number]  PLAN OF CARE: Admit for overnight observation  PATIENT DISPOSITION:  PACU - hemodynamically stable.   Delay start of Pharmacological VTE agent (>24hrs) due to surgical blood loss or risk of bleeding: not applicable

## 2016-12-03 DIAGNOSIS — N813 Complete uterovaginal prolapse: Secondary | ICD-10-CM | POA: Diagnosis not present

## 2016-12-03 DIAGNOSIS — Z833 Family history of diabetes mellitus: Secondary | ICD-10-CM | POA: Diagnosis not present

## 2016-12-03 DIAGNOSIS — D259 Leiomyoma of uterus, unspecified: Secondary | ICD-10-CM | POA: Diagnosis not present

## 2016-12-03 DIAGNOSIS — N814 Uterovaginal prolapse, unspecified: Secondary | ICD-10-CM | POA: Diagnosis not present

## 2016-12-03 DIAGNOSIS — N888 Other specified noninflammatory disorders of cervix uteri: Secondary | ICD-10-CM | POA: Diagnosis not present

## 2016-12-03 DIAGNOSIS — R51 Headache: Secondary | ICD-10-CM | POA: Diagnosis not present

## 2016-12-03 DIAGNOSIS — Z8249 Family history of ischemic heart disease and other diseases of the circulatory system: Secondary | ICD-10-CM | POA: Diagnosis not present

## 2016-12-03 DIAGNOSIS — Z8379 Family history of other diseases of the digestive system: Secondary | ICD-10-CM | POA: Diagnosis not present

## 2016-12-03 LAB — BASIC METABOLIC PANEL
Anion gap: 7 (ref 5–15)
BUN: 7 mg/dL (ref 6–20)
CHLORIDE: 106 mmol/L (ref 101–111)
CO2: 24 mmol/L (ref 22–32)
Calcium: 8.6 mg/dL — ABNORMAL LOW (ref 8.9–10.3)
Creatinine, Ser: 0.68 mg/dL (ref 0.44–1.00)
GFR calc Af Amer: >60 mL/min (ref >60)
GFR calc non Af Amer: >60 mL/min (ref >60)
GLUCOSE: 103 mg/dL — AB (ref 65–99)
POTASSIUM: 3.6 mmol/L (ref 3.5–5.1)
Sodium: 137 mmol/L (ref 135–145)

## 2016-12-03 LAB — CBC
HEMATOCRIT: 32.9 % — AB (ref 36.0–46.0)
Hemoglobin: 11.5 g/dL — ABNORMAL LOW (ref 12.0–15.0)
MCH: 30.7 pg (ref 26.0–34.0)
MCHC: 35 g/dL (ref 30.0–36.0)
MCV: 87.7 fL (ref 78.0–100.0)
Platelets: 253 10*3/uL (ref 150–400)
RBC: 3.75 MIL/uL — ABNORMAL LOW (ref 3.87–5.11)
RDW: 12.8 % (ref 11.5–15.5)
WBC: 17.2 10*3/uL — ABNORMAL HIGH (ref 4.0–10.5)

## 2016-12-03 MED ORDER — IBUPROFEN 600 MG PO TABS
600.0000 mg | ORAL_TABLET | Freq: Four times a day (QID) | ORAL | 0 refills | Status: AC | PRN
Start: 2016-12-03 — End: ?

## 2016-12-03 NOTE — Discharge Summary (Signed)
Admission Diagnosis: Uterine prolapse  Discharge Diagnosis: Same  Hospital Course: 46 year old female with symptomatic uterine prolapse. Underwent TVH. She did great. By afternoon of POD # 0 the foley was removed and she was voiding. By the morning of POD # 1 she was ambulating, voiding, tolerating regular diet and had good pain control on Ibuprofen.  BP (!) 99/55 (BP Location: Left Arm)   Pulse 72   Temp 99 F (37.2 C) (Oral)   Resp 16   Ht 5\' 4"  (1.626 m)   Wt 60 kg (132 lb 5 oz)   LMP 11/20/2016 (Approximate)   SpO2 98%   BMI 22.71 kg/m  Results for orders placed or performed during the hospital encounter of 12/02/16 (from the past 24 hour(s))  Basic metabolic panel     Status: Abnormal   Collection Time: 12/03/16  4:39 AM  Result Value Ref Range   Sodium 137 135 - 145 mmol/L   Potassium 3.6 3.5 - 5.1 mmol/L   Chloride 106 101 - 111 mmol/L   CO2 24 22 - 32 mmol/L   Glucose, Bld 103 (H) 65 - 99 mg/dL   BUN 7 6 - 20 mg/dL   Creatinine, Ser 0.68 0.44 - 1.00 mg/dL   Calcium 8.6 (L) 8.9 - 10.3 mg/dL   GFR calc non Af Amer >60 >60 mL/min   GFR calc Af Amer >60 >60 mL/min   Anion gap 7 5 - 15  CBC     Status: Abnormal   Collection Time: 12/03/16  4:39 AM  Result Value Ref Range   WBC 17.2 (H) 4.0 - 10.5 K/uL   RBC 3.75 (L) 3.87 - 5.11 MIL/uL   Hemoglobin 11.5 (L) 12.0 - 15.0 g/dL   HCT 32.9 (L) 36.0 - 46.0 %   MCV 87.7 78.0 - 100.0 fL   MCH 30.7 26.0 - 34.0 pg   MCHC 35.0 30.0 - 36.0 g/dL   RDW 12.8 11.5 - 15.5 %   Platelets 253 150 - 400 K/uL   Abdomen is soft and non tender No vaginal bleeding noted She was discharged home in good condition Rx ibuprofen Follow up in 1 week

## 2016-12-04 ENCOUNTER — Encounter (HOSPITAL_BASED_OUTPATIENT_CLINIC_OR_DEPARTMENT_OTHER): Payer: Self-pay | Admitting: Obstetrics and Gynecology

## 2016-12-09 NOTE — Addendum Note (Signed)
Addended by: Aviva Signs M on: 12/09/2016 01:15 PM   Modules accepted: Level of Service

## 2016-12-09 NOTE — Addendum Note (Signed)
Addended by: Aviva Signs M on: 12/09/2016 01:11 PM   Modules accepted: Level of Service

## 2017-01-13 NOTE — Telephone Encounter (Signed)
error 

## 2017-04-09 DIAGNOSIS — F419 Anxiety disorder, unspecified: Secondary | ICD-10-CM | POA: Diagnosis not present

## 2017-04-09 DIAGNOSIS — M62838 Other muscle spasm: Secondary | ICD-10-CM | POA: Diagnosis not present

## 2017-04-14 ENCOUNTER — Other Ambulatory Visit: Payer: Self-pay

## 2017-04-14 ENCOUNTER — Telehealth: Payer: Self-pay | Admitting: Obstetrics and Gynecology

## 2017-04-14 DIAGNOSIS — S39012A Strain of muscle, fascia and tendon of lower back, initial encounter: Secondary | ICD-10-CM | POA: Diagnosis not present

## 2017-04-14 MED ORDER — TIZANIDINE HCL 2 MG PO CAPS: 2.0000 mg | ORAL_CAPSULE | Freq: Two times a day (BID) | ORAL | 0 refills | Status: DC | PRN

## 2017-04-14 NOTE — Telephone Encounter (Signed)
Copied from [place] [id number]. Topic: Quick Communication - See Telephone Encounter >> Apr 14, 2017 12:11 PM Percell Belt A wrote: CRM for notification. See Telephone encounter for: pt called in and stated she is having a back spasm and would like to know if Dr Tamala Julian could call her in some meds that would help?  She is request a call back from [facility] number [phone]   04/14/17.

## 2017-04-14 NOTE — Telephone Encounter (Signed)
Spoke with patient. Informed her to have medications like this called in she would need to be seen. She was last seen in Oct 2018. Patient has made an appointment for 3/18. Patient refused to see another doctor, I offered Dr.Schmitz. But she stated if nothing could be called in she was going to urgent care. Please advise or follow up with patient, if you would like done differently.

## 2017-04-14 NOTE — Telephone Encounter (Signed)
Per a verbal from Dr. Tamala Julian, rx for zanaflex was called in for patient.

## 2017-04-16 ENCOUNTER — Other Ambulatory Visit: Payer: Self-pay | Admitting: Family Medicine

## 2017-04-16 NOTE — Telephone Encounter (Signed)
Refill done.  

## 2017-04-22 NOTE — Progress Notes (Signed)
[name] [address], [place] [address] Phone: [phone] Subjective:    I'm seeing this patient by the request  of:    CC: Back pain follow-up  [id number]  Renee [name] is a 47 y.o. female coming in with complaint of back pain.  Patient was having more radicular symptoms again.  Was having more muscle spasms.  Low-dose of gabapentin and has a muscle relaxer.  Been quite some time since we have seen patient.  Did have a hysterectomy in October of last year.  Patient states having increasing discomfort and pain.  Patient was seen in the urgent care and given prednisone that did help but when she was off the prednisone started giving her more pain again.  Patient states that unable to tie shoes without significant amount of pain at this time.  Denies any fevers, chills, any abnormal weight loss.     Past Medical History:  Diagnosis Date  . Headache   . Lumbar radiculitis   . Numbness and tingling in left arm    due to overdoing in in the gym with exercising   . Vision disturbance    Past Surgical History:  Procedure Laterality Date  . VAGINAL HYSTERECTOMY N/A 12/02/2016   Procedure: HYSTERECTOMY VAGINAL WITH BILATERAL SALPINGECTOMY;  Surgeon: Dian Queen, MD;  Location: [facility];  Service: Gynecology;  Laterality: N/A;  . WISDOM TOOTH EXTRACTION     Social History   Socioeconomic History  . Marital status: Married    Spouse name: None  . Number of children: 2  . Years of education: Masters  . Highest education level: None  Social Needs  . Financial resource strain: None  . Food insecurity - worry: None  . Food insecurity - inability: None  . Transportation needs - medical: None  . Transportation needs - non-medical: None  Occupational History  . Occupation: Oceanographer  . Occupation: Chief Strategy Officer  Tobacco Use  . Smoking status: Never Smoker  . Smokeless tobacco: Never Used  Substance and  Sexual Activity  . Alcohol use: No    Alcohol/week: 0.0 oz  . Drug use: No  . Sexual activity: None  Other Topics Concern  . None  Social History Narrative   Lives at home with husband and two sons.   Right-handed.   1 cup caffeine daily.   No Known Allergies Family History  Problem Relation Age of Onset  . Diverticulitis Father   . Atrial fibrillation Mother   . Diabetes Mother   . Hypertension Mother      Past medical history, social, surgical and family history all reviewed in electronic medical record.  No pertanent information unless stated regarding to the chief complaint.   Review of Systems:Review of systems updated and as accurate as of 04/23/17  No headache, visual changes, nausea, vomiting, diarrhea, constipation, dizziness, abdominal pain, skin rash, fevers, chills, night sweats, weight loss, swollen lymph nodes, body aches, joint swelling, chest pain, shortness of breath, mood changes.  Positive muscle aches  Objective  Blood pressure 106/70, pulse 93, height 5\' 4"  (1.626 m), weight 137 lb (62.1 kg), SpO2 98 %. Systems examined below as of 04/23/17   General: No apparent distress alert and oriented x3 mood and affect normal, dressed appropriately.  HEENT: Pupils equal, extraocular movements intact  Respiratory: Patient's speak in full sentences and does not appear short of breath  Cardiovascular: No lower extremity edema, non tender, no erythema  Skin: Warm dry  intact with no signs of infection or rash on extremities or on axial skeleton.  Abdomen: Soft nontender  Neuro: Cranial nerves II through XII are intact, neurovascularly intact in all extremities with 2+ DTRs and 2+ pulses.  Lymph: No lymphadenopathy of posterior or anterior cervical chain or axillae bilaterally.  Gait normal with good balance and coordination.  MSK:  Non tender with full range of motion and good stability and symmetric strength and tone of shoulders, elbows, wrist, hip, knee and ankles  bilaterally.  Back Exam:  Inspection: Loss of lordosis Motion: Flexion 20 deg, Extension 15 deg, Side Bending to 25 deg bilaterally,  Rotation to 25 deg bilaterally  SLR laying: Mildly positive left significant tightness of the left leg XSLR laying: Mildly positive left Palpable tenderness: Tender to palpation in the paraspinal musculature lumbar spine right greater than left. FABER: Positive right. Sensory change: Gross sensation intact to all lumbar and sacral dermatomes.  Reflexes: 2+ at both patellar tendons, 2+ at achilles tendons, Babinski's downgoing.  Strength at foot  Plantar-flexion: 5/5 Dorsi-flexion: 5/5 Eversion: 5/5 Inversion: 5/5  Leg strength  Quad: 5/5 Hamstring: 5/5 Hip flexor: 5/5 Hip abductors: 5/5  Gait unremarkable.    Impression and Recommendations:     This case required medical decision making of moderate complexity.      Note: This dictation was prepared with Dragon dictation along with smaller phrase technology. Any transcriptional errors that result from this process are unintentional.

## 2017-04-23 ENCOUNTER — Encounter: Payer: Self-pay | Admitting: Family Medicine

## 2017-04-23 ENCOUNTER — Ambulatory Visit: Payer: BLUE CROSS/BLUE SHIELD | Admitting: Family Medicine

## 2017-04-23 DIAGNOSIS — M5416 Radiculopathy, lumbar region: Secondary | ICD-10-CM

## 2017-04-23 MED ORDER — PREDNISONE 50 MG PO TABS: 50.0000 mg | ORAL_TABLET | Freq: Every day | ORAL | 0 refills | Status: DC

## 2017-04-23 MED ORDER — TIZANIDINE HCL 2 MG PO CAPS: 2.0000 mg | ORAL_CAPSULE | Freq: Three times a day (TID) | ORAL | 0 refills | Status: DC | PRN

## 2017-04-23 NOTE — Patient Instructions (Signed)
I am sorry you are hurting.  Zanaflex 1 in AM , 1 in pm and 2 at night Prednisone daily for 1 week  Ice 20 minutes 2 times daily. Usually after activity and before bed. Try to stay a little active See me again in 1 week (ok to double book)

## 2017-04-23 NOTE — Assessment & Plan Note (Signed)
Patient is having more of a left lumbar radiculitis.  Patient does not have weakness but does have a positive straight leg test.  Discussed with patient if worsening symptoms advanced imaging would be needed with this being the third exacerbation over the course of last 15 months.  Unable to do osteopathic manipulation at this time.  Will consider repeating physical therapy depending on how patient responds over the next week.  Prednisone, gabapentin, muscle relaxer given.  Follow-up again 4 weeks

## 2017-04-24 ENCOUNTER — Encounter: Payer: Self-pay | Admitting: Family Medicine

## 2017-04-27 ENCOUNTER — Ambulatory Visit: Payer: BLUE CROSS/BLUE SHIELD | Admitting: Family Medicine

## 2017-04-29 ENCOUNTER — Ambulatory Visit: Payer: Self-pay | Admitting: *Deleted

## 2017-04-29 NOTE — Telephone Encounter (Signed)
Patient is calling to report that she may have over worked this week end and aggravated her back. She was massaging her back and she had sudden numbness in her left leg- it has gotten better now and she is walking on it. She has finished the Prednisone and she has muscle relaxer and gabapentin to take until her appointment tomorrow morning. Told patient would forward her call so her provider would be aware. Reason for Disposition . Numbness in a leg or foot (i.e., loss of sensation)  Answer Assessment - Initial Assessment Questions 1. ONSET: "When did the pain start?"      Across gluteal and across outside hip flexor - new after injury- usually happens when sitting or laying- numbness this morning after patient massaged herself 2. LOCATION: "Where is the pain located?"      See above 3. PAIN: "How bad is the pain?"    (Scale 1-10; or mild, moderate, severe)   -  MILD (1-3): doesn't interfere with normal activities    -  MODERATE (4-7): interferes with normal activities (e.g., work or school) or awakens from sleep, limping    -  SEVERE (8-10): excruciating pain, unable to do any normal activities, unable to walk     Numbness is gone when she walks 4. WORK OR EXERCISE: "Has there been any recent work or exercise that involved this part of the body?"      Yes- Sunday she was more active 5. CAUSE: "What do you think is causing the leg pain?"     Too much activity 6. OTHER SYMPTOMS: "Do you have any other symptoms?" (e.g., chest pain, back pain, breathing difficulty, swelling, rash, fever, numbness, weakness)     Back pain  7. PREGNANCY: "Is there any chance you are pregnant?" "When was your last menstrual period?"     n/a  Protocols used: LEG PAIN-A-AH

## 2017-04-29 NOTE — Telephone Encounter (Signed)
Patient hs a appt tomorrow with Dr. Tamala Julian in regards to her back. She states she was working her back by massaging it. She states now her L leg has gone numb. She states she can walk on it, but when she sits the numbness gets strong. She would like to know will she be okay until her appt in the morning. Please contact patient.

## 2017-04-29 NOTE — Progress Notes (Signed)
[name] [place],  [address] Phone: [phone] Subjective:     CC: Back pain follow-up  [id number]  Renee [name] is a 47 y.o. female coming in with complaint of back pain follow-up.  Patient was seen 1 week ago and was having a left-sided lumbar radiculitis.  Given prednisone and gabapentin.  Patient states that he had worsening symptoms in the left leg is gotten numb.  Patient states that she moved some tables about 10 days ago and she had an increase in her pain after that incident. Patient has been icing, taking epsom salt baths. Patient is having radicular symptoms down the left leg. She is having trouble sleeping due to the pain. Patient states that she is having numbness in the left heel after she massaged her left glute trying to alleviate her pain.      Past Medical History:  Diagnosis Date  . Headache   . Lumbar radiculitis   . Numbness and tingling in left arm    due to overdoing in in the gym with exercising   . Vision disturbance    Past Surgical History:  Procedure Laterality Date  . VAGINAL HYSTERECTOMY N/A 12/02/2016   Procedure: HYSTERECTOMY VAGINAL WITH BILATERAL SALPINGECTOMY;  Surgeon: Dian Queen, MD;  Location: [facility];  Service: Gynecology;  Laterality: N/A;  . WISDOM TOOTH EXTRACTION     Social History   Socioeconomic History  . Marital status: Married    Spouse name: Not on file  . Number of children: 2  . Years of education: Masters  . Highest education level: Not on file  Occupational History  . Occupation: Oceanographer  . Occupation: Chief Strategy Officer  Social Needs  . Financial resource strain: Not on file  . Food insecurity:    Worry: Not on file    Inability: Not on file  . Transportation needs:    Medical: Not on file    Non-medical: Not on file  Tobacco Use  . Smoking status: Never Smoker  . Smokeless tobacco: Never Used  Substance and Sexual  Activity  . Alcohol use: No    Alcohol/week: 0.0 oz  . Drug use: No  . Sexual activity: Not on file  Lifestyle  . Physical activity:    Days per week: Not on file    Minutes per session: Not on file  . Stress: Not on file  Relationships  . Social connections:    Talks on phone: Not on file    Gets together: Not on file    Attends religious service: Not on file    Active member of club or organization: Not on file    Attends meetings of clubs or organizations: Not on file    Relationship status: Not on file  Other Topics Concern  . Not on file  Social History Narrative   Lives at home with husband and two sons.   Right-handed.   1 cup caffeine daily.   No Known Allergies Family History  Problem Relation Age of Onset  . Diverticulitis Father   . Atrial fibrillation Mother   . Diabetes Mother   . Hypertension Mother      Past medical history, social, surgical and family history all reviewed in electronic medical record.  No pertanent information unless stated regarding to the chief complaint.   Review of Systems:Review of systems updated and as accurate as of 04/30/17  No headache, visual changes, nausea, vomiting, diarrhea, constipation, dizziness, abdominal  pain, skin rash, fevers, chills, night sweats, weight loss, swollen lymph nodes, body aches, joint swelling, chest pain, shortness of breath, mood changes.  Positive muscle aches  Objective  Blood pressure 100/72, pulse (!) 105, weight 137 lb (62.1 kg), SpO2 98 %. Systems examined below as of 04/30/17   General: No apparent distress alert and oriented x3 mood and affect normal, dressed appropriately.  HEENT: Pupils equal, extraocular movements intact  Respiratory: Patient's speak in full sentences and does not appear short of breath  Cardiovascular: No lower extremity edema, non tender, no erythema  Skin: Warm dry intact with no signs of infection or rash on extremities or on axial skeleton.  Abdomen: Soft nontender   Neuro: Cranial nerves II through XII are intact, neurovascularly intact in all extremities with 2+ DTRs and 2+ pulses.  Lymph: No lymphadenopathy of posterior or anterior cervical chain or axillae bilaterally.  Gait cautious  MSK:  Non tender with full range of motion and good stability and symmetric strength and tone of shoulders, elbows, wrist, hip, knee and ankles bilaterally.   Back Exam:  Inspection: Loss of lordosis Motion: Flexion 30 deg, Extension 25 deg, Side Bending to 35 deg bilaterally, Rotation to 35 deg bilaterally  SLR laying: Negative  XSLR laying: Negative  Palpable tenderness: Tender to palpation.Marland Kitchen FABER: Positive left. Sensory change: Gross sensation intact to all lumbar and sacral dermatomes.  Reflexes: 2+ at both patellar tendons, 2+ at achilles tendons, Babinski's downgoing.  Strength at foot  Plantar-flexion: 5/5 Dorsi-flexion: 5/5 Eversion: 5/5 Inversion: 5/5  Leg strength  Quad: 5/5 Hamstring: 5/5 Hip flexor: 5/5 Hip abductors: 4/5  Gait unremarkable.      Impression and Recommendations:     This case required medical decision making of moderate complexity.      Note: This dictation was prepared with Dragon dictation along with smaller phrase technology. Any transcriptional errors that result from this process are unintentional.

## 2017-04-30 ENCOUNTER — Encounter: Payer: Self-pay | Admitting: Family Medicine

## 2017-04-30 ENCOUNTER — Ambulatory Visit: Payer: BLUE CROSS/BLUE SHIELD | Admitting: Family Medicine

## 2017-04-30 DIAGNOSIS — M5416 Radiculopathy, lumbar region: Secondary | ICD-10-CM

## 2017-04-30 NOTE — Assessment & Plan Note (Signed)
Left lateral radicular pain still existed.  Patient no longer has this a positive straight leg test.  Patient did respond fairly well to the gabapentin.  We discussed the possibility of advanced imaging if weakness returns.  Patient wants to try conservative therapy.  Given 6 days of oral anti-inflammatories, icing regimen.  Follow-up again in 2 weeks.  Starting with formal physical therapy again as well.

## 2017-04-30 NOTE — Patient Instructions (Signed)
Good to see you  Renee [name] is your friend.  Start the stretching but monitor Call integrative therapy  Duexis 3 times a day for 6 days  Continue the muscle relaxer Gabapentin increase to 300mg  at night See me again in 10-12 days

## 2017-05-04 DIAGNOSIS — R6884 Jaw pain: Secondary | ICD-10-CM | POA: Diagnosis not present

## 2017-05-04 DIAGNOSIS — M79669 Pain in unspecified lower leg: Secondary | ICD-10-CM | POA: Diagnosis not present

## 2017-05-04 DIAGNOSIS — M5416 Radiculopathy, lumbar region: Secondary | ICD-10-CM | POA: Diagnosis not present

## 2017-05-04 DIAGNOSIS — R51 Headache: Secondary | ICD-10-CM | POA: Diagnosis not present

## 2017-05-07 DIAGNOSIS — M5416 Radiculopathy, lumbar region: Secondary | ICD-10-CM | POA: Diagnosis not present

## 2017-05-07 DIAGNOSIS — M79669 Pain in unspecified lower leg: Secondary | ICD-10-CM | POA: Diagnosis not present

## 2017-05-07 DIAGNOSIS — R51 Headache: Secondary | ICD-10-CM | POA: Diagnosis not present

## 2017-05-07 DIAGNOSIS — R6884 Jaw pain: Secondary | ICD-10-CM | POA: Diagnosis not present

## 2017-05-11 NOTE — Progress Notes (Signed)
[name] [place], [place] [address] Phone: [phone] Subjective:     CC: Back pain follow-up  [id number]  Renee [name] is a 47 y.o. female coming in with complaint of back pain. She continues to have a numbness in her foot, The sensation moves from the metatarsal head to the heel to the midfoot. Pain is also present in the left glute when she wakes up. Overall she does have improvement from physical therapy.  Making progress.  Not having any radicular pain but continues to have some numbness of the foot.  Patient states that overall she thinks she is making progress.     Past Medical History:  Diagnosis Date  . Headache   . Lumbar radiculitis   . Numbness and tingling in left arm    due to overdoing in in the gym with exercising   . Vision disturbance    Past Surgical History:  Procedure Laterality Date  . VAGINAL HYSTERECTOMY N/A 12/02/2016   Procedure: HYSTERECTOMY VAGINAL WITH BILATERAL SALPINGECTOMY;  Surgeon: Dian Queen, MD;  Location: [facility];  Service: Gynecology;  Laterality: N/A;  . WISDOM TOOTH EXTRACTION     Social History   Socioeconomic History  . Marital status: Married    Spouse name: Not on file  . Number of children: 2  . Years of education: Masters  . Highest education level: Not on file  Occupational History  . Occupation: Oceanographer  . Occupation: Chief Strategy Officer  Social Needs  . Financial resource strain: Not on file  . Food insecurity:    Worry: Not on file    Inability: Not on file  . Transportation needs:    Medical: Not on file    Non-medical: Not on file  Tobacco Use  . Smoking status: Never Smoker  . Smokeless tobacco: Never Used  Substance and Sexual Activity  . Alcohol use: No    Alcohol/week: 0.0 oz  . Drug use: No  . Sexual activity: Not on file  Lifestyle  . Physical activity:    Days per week: Not on file    Minutes per session: Not on  file  . Stress: Not on file  Relationships  . Social connections:    Talks on phone: Not on file    Gets together: Not on file    Attends religious service: Not on file    Active member of club or organization: Not on file    Attends meetings of clubs or organizations: Not on file    Relationship status: Not on file  Other Topics Concern  . Not on file  Social History Narrative   Lives at home with husband and two sons.   Right-handed.   1 cup caffeine daily.   No Known Allergies Family History  Problem Relation Age of Onset  . Diverticulitis Father   . Atrial fibrillation Mother   . Diabetes Mother   . Hypertension Mother      Past medical history, social, surgical and family history all reviewed in electronic medical record.  No pertanent information unless stated regarding to the chief complaint.   Review of Systems:Review of systems updated and as accurate as of 05/12/17  No headache, visual changes, nausea, vomiting, diarrhea, constipation, dizziness, abdominal pain, skin rash, fevers, chills, night sweats, weight loss, swollen lymph nodes, body aches, joint swelling, muscle aches, chest pain, shortness of breath, mood changes.   Objective  Blood pressure 132/86, pulse 91, height 5'  4" (1.626 m), weight 138 lb (62.6 kg), SpO2 97 %. Systems examined below as of 05/12/17   General: No apparent distress alert and oriented x3 mood and affect normal, dressed appropriately.  HEENT: Pupils equal, extraocular movements intact  Respiratory: Patient's speak in full sentences and does not appear short of breath  Cardiovascular: No lower extremity edema, non tender, no erythema  Skin: Warm dry intact with no signs of infection or rash on extremities or on axial skeleton.  Abdomen: Soft nontender  Neuro: Cranial nerves II through XII are intact, neurovascularly intact in all extremities with 2+ DTRs and 2+ pulses.  Lymph: No lymphadenopathy of posterior or anterior cervical chain  or axillae bilaterally.  Gait normal with good balance and coordination.  MSK:  Non tender with full range of motion and good stability and symmetric strength and tone of shoulders, elbows, wrist, hip, knee and ankles bilaterally.  Back Exam:  Inspection: Mild loss of lordosis Motion: Flexion 45 deg, Extension 35 deg, Side Bending to 35 deg bilaterally,  Rotation to 35 deg bilaterally  SLR laying: Negative negative but tight left hamstring XSLR laying: Negative  Palpable tenderness: Tender to palpation the paraspinal musculature lumbar spine right greater than left. FABER: Tightness of the left. Sensory change: Gross sensation intact to all lumbar and sacral dermatomes.  Reflexes: 2+ at both patellar tendons, 2+ at achilles tendons, Babinski's downgoing.  Strength at foot  Plantar-flexion: 5/5 Dorsi-flexion: 5/5 Eversion: 5/5 Inversion: 5/5  Leg strength  Quad: 5/5 Hamstring: 5/5 Hip flexor: 5/5 Hip abductors: 4/5 but symmetric Gait unremarkable.  Osteopathic findings  T9 extended rotated and side bent left L2 flexed rotated and side bent right L5 flexed rotated and side bent left Sacrum right on right     Impression and Recommendations:     This case required medical decision making of moderate complexity.      Note: This dictation was prepared with Dragon dictation along with smaller phrase technology. Any transcriptional errors that result from this process are unintentional.

## 2017-05-12 ENCOUNTER — Ambulatory Visit: Payer: BLUE CROSS/BLUE SHIELD | Admitting: Family Medicine

## 2017-05-12 ENCOUNTER — Encounter: Payer: Self-pay | Admitting: Family Medicine

## 2017-05-12 VITALS — BP 132/86 | HR 91 | Ht 64.0 in | Wt 138.0 lb

## 2017-05-12 DIAGNOSIS — M5416 Radiculopathy, lumbar region: Secondary | ICD-10-CM | POA: Diagnosis not present

## 2017-05-12 DIAGNOSIS — M999 Biomechanical lesion, unspecified: Secondary | ICD-10-CM | POA: Diagnosis not present

## 2017-05-12 NOTE — Patient Instructions (Signed)
Good to see you  Renee [name] is your friend.  Maybe muscle relaxer tonight Keep up with PT  Continue the gabapentin  See me again in 4 weeks

## 2017-05-12 NOTE — Assessment & Plan Note (Signed)
Significant improvement at this time.  Discussed icing regimen and home exercises.  Discussed which activities of doing which wants to avoid.  Patient will follow up with me again in 4 weeks

## 2017-05-12 NOTE — Assessment & Plan Note (Signed)
Decision today to treat with OMT was based on Physical Exam  After verbal consent patient was treated with HVLA, ME, FPR techniques in cervical, thoracic, lumbar and sacral areas  Patient tolerated the procedure well with improvement in symptoms  Patient given exercises, stretches and lifestyle modifications  See medications in patient instructions if given  Patient will follow up in 4 weeks 

## 2017-05-14 DIAGNOSIS — M5416 Radiculopathy, lumbar region: Secondary | ICD-10-CM | POA: Diagnosis not present

## 2017-05-14 DIAGNOSIS — R51 Headache: Secondary | ICD-10-CM | POA: Diagnosis not present

## 2017-05-14 DIAGNOSIS — R6884 Jaw pain: Secondary | ICD-10-CM | POA: Diagnosis not present

## 2017-05-14 DIAGNOSIS — M79669 Pain in unspecified lower leg: Secondary | ICD-10-CM | POA: Diagnosis not present

## 2017-05-21 DIAGNOSIS — R51 Headache: Secondary | ICD-10-CM | POA: Diagnosis not present

## 2017-05-21 DIAGNOSIS — M79669 Pain in unspecified lower leg: Secondary | ICD-10-CM | POA: Diagnosis not present

## 2017-05-21 DIAGNOSIS — M5416 Radiculopathy, lumbar region: Secondary | ICD-10-CM | POA: Diagnosis not present

## 2017-05-21 DIAGNOSIS — R6884 Jaw pain: Secondary | ICD-10-CM | POA: Diagnosis not present

## 2017-05-25 DIAGNOSIS — R6884 Jaw pain: Secondary | ICD-10-CM | POA: Diagnosis not present

## 2017-05-25 DIAGNOSIS — R51 Headache: Secondary | ICD-10-CM | POA: Diagnosis not present

## 2017-05-25 DIAGNOSIS — M79669 Pain in unspecified lower leg: Secondary | ICD-10-CM | POA: Diagnosis not present

## 2017-05-25 DIAGNOSIS — M5416 Radiculopathy, lumbar region: Secondary | ICD-10-CM | POA: Diagnosis not present

## 2017-05-28 DIAGNOSIS — M79669 Pain in unspecified lower leg: Secondary | ICD-10-CM | POA: Diagnosis not present

## 2017-05-28 DIAGNOSIS — M5416 Radiculopathy, lumbar region: Secondary | ICD-10-CM | POA: Diagnosis not present

## 2017-05-28 DIAGNOSIS — R51 Headache: Secondary | ICD-10-CM | POA: Diagnosis not present

## 2017-05-28 DIAGNOSIS — R6884 Jaw pain: Secondary | ICD-10-CM | POA: Diagnosis not present

## 2017-06-10 DIAGNOSIS — R6884 Jaw pain: Secondary | ICD-10-CM | POA: Diagnosis not present

## 2017-06-10 DIAGNOSIS — R51 Headache: Secondary | ICD-10-CM | POA: Diagnosis not present

## 2017-06-10 DIAGNOSIS — M79669 Pain in unspecified lower leg: Secondary | ICD-10-CM | POA: Diagnosis not present

## 2017-06-10 DIAGNOSIS — M5416 Radiculopathy, lumbar region: Secondary | ICD-10-CM | POA: Diagnosis not present

## 2017-06-11 ENCOUNTER — Ambulatory Visit: Payer: BLUE CROSS/BLUE SHIELD | Admitting: Family Medicine

## 2017-06-11 ENCOUNTER — Encounter: Payer: Self-pay | Admitting: Family Medicine

## 2017-06-11 VITALS — BP 120/88 | HR 84 | Ht 64.0 in | Wt 139.0 lb

## 2017-06-11 DIAGNOSIS — M5416 Radiculopathy, lumbar region: Secondary | ICD-10-CM | POA: Diagnosis not present

## 2017-06-11 DIAGNOSIS — M999 Biomechanical lesion, unspecified: Secondary | ICD-10-CM | POA: Diagnosis not present

## 2017-06-11 NOTE — Patient Instructions (Signed)
Good to see you  Renee [name] is your friend.  Stay active Do the ibuprofen if in pain  See me again in 8 weeks

## 2017-06-11 NOTE — Assessment & Plan Note (Signed)
No longer having the radicular symptoms.  Restarted the manipulation.  Has responded well to this previously.  Still does not want any manipulation of the neck.  Discussed icing regimen.  Has muscle relaxer for breakthrough.  Patient can take ibuprofen as needed.  Follow-up with me again 4 to 8 weeks

## 2017-06-11 NOTE — Progress Notes (Signed)
[name] [place], [place] [address] Phone: [phone] Subjective:     CC: Back pain follow-up  NTI:[id number]  Renee [name] is a 47 y.o. female coming in with complaint of back pain. She has had a few sessions of physical therapy which has had a positive effect on her back pain.  Patient states that there is days when she does not think about it quite as severe.  Working on pelvic floor dysfunction as well.  Feels like the minimal tingling in the toes is very intermittent now and is very happy with the results.  States that she is 90% herself     Past Medical History:  Diagnosis Date  . Headache   . Lumbar radiculitis   . Numbness and tingling in left arm    due to overdoing in in the gym with exercising   . Vision disturbance    Past Surgical History:  Procedure Laterality Date  . VAGINAL HYSTERECTOMY N/A 12/02/2016   Procedure: HYSTERECTOMY VAGINAL WITH BILATERAL SALPINGECTOMY;  Surgeon: Dian Queen, MD;  Location: [facility];  Service: Gynecology;  Laterality: N/A;  . WISDOM TOOTH EXTRACTION     Social History   Socioeconomic History  . Marital status: Married    Spouse name: Not on file  . Number of children: 2  . Years of education: Masters  . Highest education level: Not on file  Occupational History  . Occupation: Oceanographer  . Occupation: Chief Strategy Officer  Social Needs  . Financial resource strain: Not on file  . Food insecurity:    Worry: Not on file    Inability: Not on file  . Transportation needs:    Medical: Not on file    Non-medical: Not on file  Tobacco Use  . Smoking status: Never Smoker  . Smokeless tobacco: Never Used  Substance and Sexual Activity  . Alcohol use: No    Alcohol/week: 0.0 oz  . Drug use: No  . Sexual activity: Not on file  Lifestyle  . Physical activity:    Days per week: Not on file    Minutes per session: Not on file  . Stress: Not on file    Relationships  . Social connections:    Talks on phone: Not on file    Gets together: Not on file    Attends religious service: Not on file    Active member of club or organization: Not on file    Attends meetings of clubs or organizations: Not on file    Relationship status: Not on file  Other Topics Concern  . Not on file  Social History Narrative   Lives at home with husband and two sons.   Right-handed.   1 cup caffeine daily.   No Known Allergies Family History  Problem Relation Age of Onset  . Diverticulitis Father   . Atrial fibrillation Mother   . Diabetes Mother   . Hypertension Mother      Past medical history, social, surgical and family history all reviewed in electronic medical record.  No pertanent information unless stated regarding to the chief complaint.   Review of Systems:Review of systems updated and as accurate as of 06/11/17  No headache, visual changes, nausea, vomiting, diarrhea, constipation, dizziness, abdominal pain, skin rash, fevers, chills, night sweats, weight loss, swollen lymph nodes, body aches, joint swelling,  chest pain, shortness of breath, mood changes.  Positive muscle aches  Objective  Blood pressure 120/88, pulse  84, height 5\' 4"  (1.626 m), weight 139 lb (63 kg), SpO2 98 %. Systems examined below as of 06/11/17   General: No apparent distress alert and oriented x3 mood and affect normal, dressed appropriately.  HEENT: Pupils equal, extraocular movements intact  Respiratory: Patient's speak in full sentences and does not appear short of breath  Cardiovascular: No lower extremity edema, non tender, no erythema  Skin: Warm dry intact with no signs of infection or rash on extremities or on axial skeleton.  Abdomen: Soft nontender  Neuro: Cranial nerves II through XII are intact, neurovascularly intact in all extremities with 2+ DTRs and 2+ pulses.  Lymph: No lymphadenopathy of posterior or anterior cervical chain or axillae bilaterally.   Gait normal with good balance and coordination.  MSK:  Non tender with full range of motion and good stability and symmetric strength and tone of shoulders, elbows, wrist, hip, knee and ankles bilaterally.   Back Exam:  Inspection: Very mild loss of lordosis Motion: Flexion 35 deg, Extension 15 deg, Side Bending to 45 deg bilaterally,  Rotation to 45 deg bilaterally  SLR laying: Negative  XSLR laying: Negative  Palpable tenderness: Mild tenderness in the L5-S1 vicinity of the paraspinal musculature bilaterally. FABER: negative. Sensory change: Gross sensation intact to all lumbar and sacral dermatomes.  Reflexes: 2+ at both patellar tendons, 2+ at achilles tendons, Babinski's downgoing.  Strength at foot  Plantar-flexion: 5/5 Dorsi-flexion: 5/5 Eversion: 5/5 Inversion: 5/5  Leg strength  Quad: 5/5 Hamstring: 5/5 Hip flexor: 5/5 Hip abductors: 5/5  Gait unremarkable.  Osteopathic findings T3 extended rotated and side bent right inhaled third rib T6 extended rotated and side bent left L2 flexed rotated and side bent right Sacrum right on right    Impression and Recommendations:     This case required medical decision making of moderate complexity.      Note: This dictation was prepared with Dragon dictation along with smaller phrase technology. Any transcriptional errors that result from this process are unintentional.

## 2017-06-11 NOTE — Assessment & Plan Note (Signed)
Decision today to treat with OMT was based on Physical Exam  After verbal consent patient was treated with HVLA, ME, FPR techniques in  thoracic, lumbar and sacral areas  Patient tolerated the procedure well with improvement in symptoms  Patient given exercises, stretches and lifestyle modifications  See medications in patient instructions if given  Patient will follow up in 4-8 weeks 

## 2017-06-15 DIAGNOSIS — R51 Headache: Secondary | ICD-10-CM | POA: Diagnosis not present

## 2017-06-15 DIAGNOSIS — M5416 Radiculopathy, lumbar region: Secondary | ICD-10-CM | POA: Diagnosis not present

## 2017-06-15 DIAGNOSIS — M79669 Pain in unspecified lower leg: Secondary | ICD-10-CM | POA: Diagnosis not present

## 2017-06-15 DIAGNOSIS — R6884 Jaw pain: Secondary | ICD-10-CM | POA: Diagnosis not present

## 2017-07-14 DIAGNOSIS — R6884 Jaw pain: Secondary | ICD-10-CM | POA: Diagnosis not present

## 2017-07-14 DIAGNOSIS — R51 Headache: Secondary | ICD-10-CM | POA: Diagnosis not present

## 2017-07-14 DIAGNOSIS — M5416 Radiculopathy, lumbar region: Secondary | ICD-10-CM | POA: Diagnosis not present

## 2017-07-14 DIAGNOSIS — M79669 Pain in unspecified lower leg: Secondary | ICD-10-CM | POA: Diagnosis not present

## 2017-07-17 DIAGNOSIS — R51 Headache: Secondary | ICD-10-CM | POA: Diagnosis not present

## 2017-07-17 DIAGNOSIS — R6884 Jaw pain: Secondary | ICD-10-CM | POA: Diagnosis not present

## 2017-07-17 DIAGNOSIS — M79669 Pain in unspecified lower leg: Secondary | ICD-10-CM | POA: Diagnosis not present

## 2017-07-17 DIAGNOSIS — M5416 Radiculopathy, lumbar region: Secondary | ICD-10-CM | POA: Diagnosis not present

## 2017-08-05 NOTE — Progress Notes (Signed)
[name] [place], [place] [address] Phone: [phone] Subjective:      CC:   Back pain follow-up  [id number]  Renee [name] is a 47 y.o. female coming in with complaint of back pain.  We discussed with patient about which activities of doing which wants to avoid.  Patient has noted some tightness.  There is no radiation of the pain.  Has finished formal physical therapy.     Past Medical History:  Diagnosis Date  . Headache   . Lumbar radiculitis   . Numbness and tingling in left arm    due to overdoing in in the gym with exercising   . Vision disturbance    Past Surgical History:  Procedure Laterality Date  . VAGINAL HYSTERECTOMY N/A 12/02/2016   Procedure: HYSTERECTOMY VAGINAL WITH BILATERAL SALPINGECTOMY;  Surgeon: Dian Queen, MD;  Location: [facility];  Service: Gynecology;  Laterality: N/A;  . WISDOM TOOTH EXTRACTION     Social History   Socioeconomic History  . Marital status: Married    Spouse name: Not on file  . Number of children: 2  . Years of education: Masters  . Highest education level: Not on file  Occupational History  . Occupation: Oceanographer  . Occupation: Chief Strategy Officer  Social Needs  . Financial resource strain: Not on file  . Food insecurity:    Worry: Not on file    Inability: Not on file  . Transportation needs:    Medical: Not on file    Non-medical: Not on file  Tobacco Use  . Smoking status: Never Smoker  . Smokeless tobacco: Never Used  Substance and Sexual Activity  . Alcohol use: No    Alcohol/week: 0.0 oz  . Drug use: No  . Sexual activity: Not on file  Lifestyle  . Physical activity:    Days per week: Not on file    Minutes per session: Not on file  . Stress: Not on file  Relationships  . Social connections:    Talks on phone: Not on file    Gets together: Not on file    Attends religious service: Not on file    Active member of club or  organization: Not on file    Attends meetings of clubs or organizations: Not on file    Relationship status: Not on file  Other Topics Concern  . Not on file  Social History Narrative   Lives at home with husband and two sons.   Right-handed.   1 cup caffeine daily.   No Known Allergies Family History  Problem Relation Age of Onset  . Diverticulitis Father   . Atrial fibrillation Mother   . Diabetes Mother   . Hypertension Mother      Past medical history, social, surgical and family history all reviewed in electronic medical record.  No pertanent information unless stated regarding to the chief complaint.   Review of Systems:Review of systems updated and as accurate as of 08/06/17  No headache, visual changes, nausea, vomiting, diarrhea, constipation, dizziness, abdominal pain, skin rash, fevers, chills, night sweats, weight loss, swollen lymph nodes, body aches, joint swelling, muscle aches, chest pain, shortness of breath, mood changes.   Objective  Blood pressure 126/70, pulse 89, height 5\' 4"  (1.626 m), weight 134 lb (60.8 kg), SpO2 99 %. Systems examined below as of 08/06/17   General: No apparent distress alert and oriented x3 mood and affect normal, dressed appropriately.  HEENT: Pupils equal, extraocular movements intact  Respiratory: Patient's speak in full sentences and does not appear short of breath  Cardiovascular: No lower extremity edema, non tender, no erythema  Skin: Warm dry intact with no signs of infection or rash on extremities or on axial skeleton.  Abdomen: Soft nontender  Neuro: Cranial nerves II through XII are intact, neurovascularly intact in all extremities with 2+ DTRs and 2+ pulses.  Lymph: No lymphadenopathy of posterior or anterior cervical chain or axillae bilaterally.  Gait normal with good balance and coordination.  MSK:  Non tender with full range of motion and good stability and symmetric strength and tone of shoulders, elbows, wrist, hip,  knee and ankles bilaterally.  Back Exam:  Inspection: Unremarkable  Motion: Flexion 45 deg, Extension 45 deg, Side Bending to 35 deg bilaterally,  Rotation to 35 deg bilaterally  SLR laying: Negative  XSLR laying: Negative  Palpable tenderness: Tender to palpation the paraspinal musculature left greater than right. FABER: negative. Sensory change: Gross sensation intact to all lumbar and sacral dermatomes.  Reflexes: 2+ at both patellar tendons, 2+ at achilles tendons, Babinski's downgoing.  Strength at foot  Plantar-flexion: 5/5 Dorsi-flexion: 5/5 Eversion: 5/5 Inversion: 5/5  Leg strength  Quad: 5/5 Hamstring: 5/5 Hip flexor: 5/5 Hip abductors: 5/5  Gait unremarkable.  Osteopathic findings  T3 extended rotated and side bent right inhaled third rib T9 extended rotated and side bent left L4 flexed rotated and side bent left  Sacrum right on right      Impression and Recommendations:     This case required medical decision making of moderate complexity.      Note: This dictation was prepared with Dragon dictation along with smaller phrase technology. Any transcriptional errors that result from this process are unintentional.

## 2017-08-06 ENCOUNTER — Encounter: Payer: Self-pay | Admitting: Family Medicine

## 2017-08-06 ENCOUNTER — Ambulatory Visit: Payer: BLUE CROSS/BLUE SHIELD | Admitting: Family Medicine

## 2017-08-06 VITALS — BP 126/70 | HR 89 | Ht 64.0 in | Wt 134.0 lb

## 2017-08-06 DIAGNOSIS — M5416 Radiculopathy, lumbar region: Secondary | ICD-10-CM

## 2017-08-06 DIAGNOSIS — M999 Biomechanical lesion, unspecified: Secondary | ICD-10-CM | POA: Diagnosis not present

## 2017-08-06 NOTE — Assessment & Plan Note (Signed)
Patient we attempted osteopathic manipulation again.  Has had fairly good resolution of the radicular symptoms at this time now.  Patient has continued to work hard on the core strengthening.-Expect patient to do relatively well overall.  Follow-up again in 4 to 8 weeks

## 2017-08-06 NOTE — Patient Instructions (Signed)
Good to see you  Renee [name] is your friend.  One knee down one knee up tilt pelvis forward.  Hands overhead then rotate to upper leg.  Hold 10 seconds, relax, repeat and do other side as well.  Very important when after being in a flexed position for a long amount of time.  New exercises for the foot  Keep it up you are doing great  See me again in 7-8 weeks!

## 2017-08-06 NOTE — Assessment & Plan Note (Signed)
Decision today to treat with OMT was based on Physical Exam  After verbal consent patient was treated with HVLA, ME, FPR techniques in cervical, thoracic, lumbar and sacral areas  Patient tolerated the procedure well with improvement in symptoms  Patient given exercises, stretches and lifestyle modifications  See medications in patient instructions if given  Patient will follow up in 4-8 weeks 

## 2017-08-20 DIAGNOSIS — Z6822 Body mass index (BMI) 22.0-22.9, adult: Secondary | ICD-10-CM | POA: Diagnosis not present

## 2017-08-20 DIAGNOSIS — Z1212 Encounter for screening for malignant neoplasm of rectum: Secondary | ICD-10-CM | POA: Diagnosis not present

## 2017-08-20 DIAGNOSIS — Z01419 Encounter for gynecological examination (general) (routine) without abnormal findings: Secondary | ICD-10-CM | POA: Diagnosis not present

## 2017-08-20 DIAGNOSIS — Z1231 Encounter for screening mammogram for malignant neoplasm of breast: Secondary | ICD-10-CM | POA: Diagnosis not present

## 2017-09-11 DIAGNOSIS — R6884 Jaw pain: Secondary | ICD-10-CM | POA: Diagnosis not present

## 2017-09-11 DIAGNOSIS — M5416 Radiculopathy, lumbar region: Secondary | ICD-10-CM | POA: Diagnosis not present

## 2017-09-11 DIAGNOSIS — M79669 Pain in unspecified lower leg: Secondary | ICD-10-CM | POA: Diagnosis not present

## 2017-09-11 DIAGNOSIS — R51 Headache: Secondary | ICD-10-CM | POA: Diagnosis not present

## 2017-10-05 NOTE — Progress Notes (Signed)
[name] [place] [place], [place] [address] Phone: [phone] Subjective:   Renee [name], am serving as a scribe for Dr. Hulan Saas.   CC: Back pain follow-up  [id number]  Renee [name] is a 47 y.o. female coming in with complaint of back pain. She has been stretching daily and feels that this has been helpful to decrease her pain. She moved some things on Friday and notes that she had some pain on Saturday and Sunday. She wants to know if the dull pain that she felt is due to a lack of strength. She iced and used Advil to alleviate her pain. Patient does do deadbug and bridging for her core. Wants to incorporate planking.      Past Medical History:  Diagnosis Date  . Headache   . Lumbar radiculitis   . Numbness and tingling in left arm    due to overdoing in in the gym with exercising   . Vision disturbance    Past Surgical History:  Procedure Laterality Date  . VAGINAL HYSTERECTOMY N/A 12/02/2016   Procedure: HYSTERECTOMY VAGINAL WITH BILATERAL SALPINGECTOMY;  Surgeon: Dian Queen, MD;  Location: [facility];  Service: Gynecology;  Laterality: N/A;  . WISDOM TOOTH EXTRACTION     Social History   Socioeconomic History  . Marital status: Married    Spouse name: Not on file  . Number of children: 2  . Years of education: Masters  . Highest education level: Not on file  Occupational History  . Occupation: Oceanographer  . Occupation: Chief Strategy Officer  Social Needs  . Financial resource strain: Not on file  . Food insecurity:    Worry: Not on file    Inability: Not on file  . Transportation needs:    Medical: Not on file    Non-medical: Not on file  Tobacco Use  . Smoking status: Never Smoker  . Smokeless tobacco: Never Used  Substance and Sexual Activity  . Alcohol use: [name]    Alcohol/week: 0.0 standard drinks  . Drug use: [name]  . Sexual activity: Not on file  Lifestyle  . Physical activity:      Days per week: Not on file    Minutes per session: Not on file  . Stress: Not on file  Relationships  . Social connections:    Talks on phone: Not on file    Gets together: Not on file    Attends religious service: Not on file    Active member of club or organization: Not on file    Attends meetings of clubs or organizations: Not on file    Relationship status: Not on file  Other Topics Concern  . Not on file  Social History Narrative   Lives at home with husband and two sons.   Right-handed.   1 cup caffeine daily.   [name] Known Allergies Family History  Problem Relation Age of Onset  . Diverticulitis Father   . Atrial fibrillation Mother   . Diabetes Mother   . Hypertension Mother        Current Outpatient Medications (Analgesics):  .  ibuprofen (ADVIL,MOTRIN) 600 MG tablet, Take 1 tablet (600 mg total) by mouth every 6 (six) hours as needed (mild pain).  Current Outpatient Medications (Hematological):  Marland Kitchen  Cyanocobalamin (VITAMIN B 12 PO), Take by mouth.  Current Outpatient Medications (Other):  Marland Kitchen  Cholecalciferol (VITAMIN D) 2000 units CAPS, Take by mouth. .  Pyridoxine HCl (B-6 PO),  Take by mouth.    Past medical history, social, surgical and family history all reviewed in electronic medical record.  [name] pertanent information unless stated regarding to the chief complaint.   Review of Systems:  [name] headache, visual changes, nausea, vomiting, diarrhea, constipation, dizziness, abdominal pain, skin rash, fevers, chills, night sweats, weight loss, swollen lymph nodes, body aches, joint swelling, muscle aches, chest pain, shortness of breath, mood changes.   Objective  Blood pressure 110/84, pulse 77, height 5\' 4"  (1.626 m), weight 133 lb (60.3 kg), SpO2 99 %.    General: [name] apparent distress alert and oriented x3 mood and affect normal, dressed appropriately.  HEENT: Pupils equal, extraocular movements intact  Respiratory: Patient's speak in full sentences and  does not appear short of breath  Cardiovascular: [name] lower extremity edema, non tender, [name] erythema  Skin: Warm dry intact with [name] signs of infection or rash on extremities or on axial skeleton.  Abdomen: Soft nontender  Neuro: Cranial nerves II through XII are intact, neurovascularly intact in all extremities with 2+ DTRs and 2+ pulses.  Lymph: [name] lymphadenopathy of posterior or anterior cervical chain or axillae bilaterally.  Gait normal with good balance and coordination.  MSK:  Non tender with full range of motion and good stability and symmetric strength and tone of shoulders, elbows, wrist, hip, knee and ankles bilaterally.  Back Exam:  Inspection: Unremarkable  Motion: Flexion 45 deg, Extension 25 deg, Side Bending to 35 deg bilaterally,  Rotation to 35 deg bilaterally  SLR laying: Negative  XSLR laying: Negative  Palpable tenderness: Tender to palpation the paraspinal musculature lumbar spine right greater than left. FABER: Positive bilaterally. Sensory change: Gross sensation intact to all lumbar and sacral dermatomes.  Reflexes: 2+ at both patellar tendons, 2+ at achilles tendons, Babinski's downgoing.  Strength at foot  Plantar-flexion: 5/5 Dorsi-flexion: 5/5 Eversion: 5/5 Inversion: 5/5  Leg strength  Quad: 5/5 Hamstring: 5/5 Hip flexor: 5/5 Hip abductors: 5/5  Gait unremarkable.  Osteopathic findings T9 extended rotated and side bent left L1 flexed rotated and side bent right Sacrum right on right     Impression and Recommendations:     This case required medical decision making of moderate complexity.  The above documentation has been reviewed and is accurate and complete [name], DO        Note: This dictation was prepared with Dragon dictation along with smaller phrase technology. Any transcriptional errors that result from this process are unintentional.

## 2017-10-06 ENCOUNTER — Ambulatory Visit: Payer: BLUE CROSS/BLUE SHIELD | Admitting: Family Medicine

## 2017-10-06 ENCOUNTER — Encounter: Payer: Self-pay | Admitting: Family Medicine

## 2017-10-06 VITALS — BP 110/84 | HR 77 | Ht 64.0 in | Wt 133.0 lb

## 2017-10-06 DIAGNOSIS — M999 Biomechanical lesion, unspecified: Secondary | ICD-10-CM

## 2017-10-06 DIAGNOSIS — M5416 Radiculopathy, lumbar region: Secondary | ICD-10-CM | POA: Diagnosis not present

## 2017-10-06 NOTE — Assessment & Plan Note (Signed)
Likely no radicular symptoms.  Discussed icing regimen and home exercises.  We discussed which activities to do which wants to avoid.  Discussed posture and ergonomics.  We discussed avoiding heavy lifting.  Follow-up with me again in 4 to 8 weeks

## 2017-10-06 NOTE — Assessment & Plan Note (Signed)
Decision today to treat with OMT was based on Physical Exam  After verbal consent patient was treated with HVLA, ME, FPR techniques in  thoracic, lumbar and sacral areas  Patient tolerated the procedure well with improvement in symptoms  Patient given exercises, stretches and lifestyle modifications  See medications in patient instructions if given  Patient will follow up in 4-8 weeks 

## 2017-10-06 NOTE — Patient Instructions (Signed)
You are doing great  Proud of you Keep it up  See me again in 8-12 weeks!

## 2017-10-09 DIAGNOSIS — R6884 Jaw pain: Secondary | ICD-10-CM | POA: Diagnosis not present

## 2017-10-09 DIAGNOSIS — M79669 Pain in unspecified lower leg: Secondary | ICD-10-CM | POA: Diagnosis not present

## 2017-10-09 DIAGNOSIS — M5416 Radiculopathy, lumbar region: Secondary | ICD-10-CM | POA: Diagnosis not present

## 2017-10-09 DIAGNOSIS — R51 Headache: Secondary | ICD-10-CM | POA: Diagnosis not present

## 2017-10-29 DIAGNOSIS — R51 Headache: Secondary | ICD-10-CM | POA: Diagnosis not present

## 2017-10-29 DIAGNOSIS — M79669 Pain in unspecified lower leg: Secondary | ICD-10-CM | POA: Diagnosis not present

## 2017-10-29 DIAGNOSIS — R6884 Jaw pain: Secondary | ICD-10-CM | POA: Diagnosis not present

## 2017-10-29 DIAGNOSIS — M5416 Radiculopathy, lumbar region: Secondary | ICD-10-CM | POA: Diagnosis not present

## 2017-12-11 NOTE — Progress Notes (Signed)
[name] [address], [place] [address] Phone: [phone] Subjective:     I [name] am serving as a Education administrator for Dr. Hulan Saas.    CC: Back pain  [id number]  Renee [name] is a 47 y.o. female coming in with complaint of back pain. States that she feels a little stiff and as if she has been out of alignment.  Patient states her similar tightness overall.  Discussed icing regimen and home exercise.  Discussed which activities to do which wants to avoid.  Patient is to increase activity slowly over the course the next several weeks.  Follow-up again in 2 to 3 months.       Past Medical History:  Diagnosis Date  . Headache   . Lumbar radiculitis   . Numbness and tingling in left arm    due to overdoing in in the gym with exercising   . Vision disturbance    Past Surgical History:  Procedure Laterality Date  . VAGINAL HYSTERECTOMY N/A 12/02/2016   Procedure: HYSTERECTOMY VAGINAL WITH BILATERAL SALPINGECTOMY;  Surgeon: Dian Queen, MD;  Location: [facility];  Service: Gynecology;  Laterality: N/A;  . WISDOM TOOTH EXTRACTION     Social History   Socioeconomic History  . Marital status: Married    Spouse name: Not on file  . Number of children: 2  . Years of education: Masters  . Highest education level: Not on file  Occupational History  . Occupation: Oceanographer  . Occupation: Chief Strategy Officer  Social Needs  . Financial resource strain: Not on file  . Food insecurity:    Worry: Not on file    Inability: Not on file  . Transportation needs:    Medical: Not on file    Non-medical: Not on file  Tobacco Use  . Smoking status: Never Smoker  . Smokeless tobacco: Never Used  Substance and Sexual Activity  . Alcohol use: No    Alcohol/week: 0.0 standard drinks  . Drug use: No  . Sexual activity: Not on file  Lifestyle  . Physical activity:    Days per week: Not on file    Minutes per  session: Not on file  . Stress: Not on file  Relationships  . Social connections:    Talks on phone: Not on file    Gets together: Not on file    Attends religious service: Not on file    Active member of club or organization: Not on file    Attends meetings of clubs or organizations: Not on file    Relationship status: Not on file  Other Topics Concern  . Not on file  Social History Narrative   Lives at home with husband and two sons.   Right-handed.   1 cup caffeine daily.   No Known Allergies Family History  Problem Relation Age of Onset  . Diverticulitis Father   . Atrial fibrillation Mother   . Diabetes Mother   . Hypertension Mother        Current Outpatient Medications (Analgesics):  .  ibuprofen (ADVIL,MOTRIN) 600 MG tablet, Take 1 tablet (600 mg total) by mouth every 6 (six) hours as needed (mild pain).  Current Outpatient Medications (Hematological):  Marland Kitchen  Cyanocobalamin (VITAMIN B 12 PO), Take by mouth.  Current Outpatient Medications (Other):  Marland Kitchen  Cholecalciferol (VITAMIN D) 2000 units CAPS, Take by mouth. .  Pyridoxine HCl (B-6 PO), Take by mouth.    Past medical history,  social, surgical and family history all reviewed in electronic medical record.  No pertanent information unless stated regarding to the chief complaint.   Review of Systems:  No headache, visual changes, nausea, vomiting, diarrhea, constipation, dizziness, abdominal pain, skin rash, fevers, chills, night sweats, weight loss, swollen lymph nodes, body aches, joint swelling,  chest pain, shortness of breath, mood changes.  Positive muscle aches  Objective  Blood pressure (!) 144/74, pulse 87, height 5\' 4"  (1.626 m), weight 132 lb (59.9 kg), SpO2 99 %.    General: No apparent distress alert and oriented x3 mood and affect normal, dressed appropriately.  HEENT: Pupils equal, extraocular movements intact  Respiratory: Patient's speak in full sentences and does not appear short of breath    Cardiovascular: No lower extremity edema, non tender, no erythema  Skin: Warm dry intact with no signs of infection or rash on extremities or on axial skeleton.  Abdomen: Soft nontender  Neuro: Cranial nerves II through XII are intact, neurovascularly intact in all extremities with 2+ DTRs and 2+ pulses.  Lymph: No lymphadenopathy of posterior or anterior cervical chain or axillae bilaterally.  Gait normal with good balance and coordination.  MSK:  Non tender with full range of motion and good stability and symmetric strength and tone of shoulders, elbows, wrist, hip, knee and ankles bilaterally.   Patient's neck exam shows some mild tightness in all planes.  Patient does have tenderness to palpation bilaterally in the parascapular region in the lumbosacral area bilaterally.  Negative straight leg test.  Tightness with Corky Sox test  Osteopathic findings  T3 extended rotated and side bent right inhaled third rib T5 extended rotated and side bent left L2 flexed rotated and side bent right Sacrum right on right Pelvic shear right       Impression and Recommendations:      The above documentation has been reviewed and is accurate and complete [name], DO       Note: This dictation was prepared with Dragon dictation along with smaller phrase technology. Any transcriptional errors that result from this process are unintentional.

## 2017-12-14 ENCOUNTER — Encounter: Payer: Self-pay | Admitting: Family Medicine

## 2017-12-14 ENCOUNTER — Ambulatory Visit: Payer: BLUE CROSS/BLUE SHIELD | Admitting: Family Medicine

## 2017-12-14 VITALS — BP 144/74 | HR 87 | Ht 64.0 in | Wt 132.0 lb

## 2017-12-14 DIAGNOSIS — M999 Biomechanical lesion, unspecified: Secondary | ICD-10-CM

## 2017-12-14 DIAGNOSIS — M5416 Radiculopathy, lumbar region: Secondary | ICD-10-CM | POA: Diagnosis not present

## 2017-12-14 NOTE — Assessment & Plan Note (Signed)
Discussed with patient in great length.  No radicular symptoms.  Discussed icing regimen and home exercise.  Discussed which activities to do which wants to avoid.  Follow-up again in 4 to 8 weeks

## 2017-12-14 NOTE — Patient Instructions (Signed)
Good to see you  Ice is your friend when needed Think of layering at home  Have a good trip  See me again in 6 weeks

## 2017-12-14 NOTE — Assessment & Plan Note (Signed)
Decision today to treat with OMT was based on Physical Exam  After verbal consent patient was treated with HVLA, ME, FPR techniques in , thoracic, lumbar and sacral and pelvis  areas  Patient tolerated the procedure well with improvement in symptoms  Patient given exercises, stretches and lifestyle modifications  See medications in patient instructions if given  Patient will follow up in 4 weeks

## 2017-12-22 DIAGNOSIS — M79669 Pain in unspecified lower leg: Secondary | ICD-10-CM | POA: Diagnosis not present

## 2017-12-22 DIAGNOSIS — M5416 Radiculopathy, lumbar region: Secondary | ICD-10-CM | POA: Diagnosis not present

## 2017-12-22 DIAGNOSIS — R6884 Jaw pain: Secondary | ICD-10-CM | POA: Diagnosis not present

## 2017-12-22 DIAGNOSIS — R51 Headache: Secondary | ICD-10-CM | POA: Diagnosis not present

## 2018-01-14 DIAGNOSIS — N951 Menopausal and female climacteric states: Secondary | ICD-10-CM | POA: Diagnosis not present

## 2018-01-14 DIAGNOSIS — F419 Anxiety disorder, unspecified: Secondary | ICD-10-CM | POA: Diagnosis not present

## 2018-01-14 DIAGNOSIS — R232 Flushing: Secondary | ICD-10-CM | POA: Diagnosis not present

## 2018-01-19 ENCOUNTER — Ambulatory Visit: Payer: BLUE CROSS/BLUE SHIELD | Admitting: Family Medicine

## 2018-01-19 ENCOUNTER — Encounter: Payer: Self-pay | Admitting: Family Medicine

## 2018-01-19 VITALS — BP 110/80 | HR 69 | Ht 64.0 in | Wt 131.0 lb

## 2018-01-19 DIAGNOSIS — M999 Biomechanical lesion, unspecified: Secondary | ICD-10-CM

## 2018-01-19 DIAGNOSIS — M5416 Radiculopathy, lumbar region: Secondary | ICD-10-CM | POA: Diagnosis not present

## 2018-01-19 NOTE — Assessment & Plan Note (Signed)
Decision today to treat with OMT was based on Physical Exam  After verbal consent patient was treated with HVLA, ME, FPR techniques in  thoracic, lumbar and sacral areas  Patient tolerated the procedure well with improvement in symptoms  Patient given exercises, stretches and lifestyle modifications  See medications in patient instructions if given  Patient will follow up in 4-6 weeks 

## 2018-01-19 NOTE — Progress Notes (Signed)
[name] [address] [place], [place] [address] Phone: [phone] Subjective:    I [name] am serving as a Education administrator for Dr. Hulan Saas.   CC: Hip pain back pain follow-up  [id number]  Renee [name] is a 47 y.o. female coming in with complaint of hip pain. States that she is doing well.  Mild tightness here and there but nothing severe.  Has been significantly stressed now with 1 of her sons.  Feels like that is exacerbating some of this as well.      Past Medical History:  Diagnosis Date  . Headache   . Lumbar radiculitis   . Numbness and tingling in left arm    due to overdoing in in the gym with exercising   . Vision disturbance    Past Surgical History:  Procedure Laterality Date  . VAGINAL HYSTERECTOMY N/A 12/02/2016   Procedure: HYSTERECTOMY VAGINAL WITH BILATERAL SALPINGECTOMY;  Surgeon: Dian Queen, MD;  Location: [facility];  Service: Gynecology;  Laterality: N/A;  . WISDOM TOOTH EXTRACTION     Social History   Socioeconomic History  . Marital status: Married    Spouse name: Not on file  . Number of children: 2  . Years of education: Masters  . Highest education level: Not on file  Occupational History  . Occupation: Oceanographer  . Occupation: Chief Strategy Officer  Social Needs  . Financial resource strain: Not on file  . Food insecurity:    Worry: Not on file    Inability: Not on file  . Transportation needs:    Medical: Not on file    Non-medical: Not on file  Tobacco Use  . Smoking status: Never Smoker  . Smokeless tobacco: Never Used  Substance and Sexual Activity  . Alcohol use: No    Alcohol/week: 0.0 standard drinks  . Drug use: No  . Sexual activity: Not on file  Lifestyle  . Physical activity:    Days per week: Not on file    Minutes per session: Not on file  . Stress: Not on file  Relationships  . Social connections:    Talks on phone: Not on file    Gets  together: Not on file    Attends religious service: Not on file    Active member of club or organization: Not on file    Attends meetings of clubs or organizations: Not on file    Relationship status: Not on file  Other Topics Concern  . Not on file  Social History Narrative   Lives at home with husband and two sons.   Right-handed.   1 cup caffeine daily.   No Known Allergies Family History  Problem Relation Age of Onset  . Diverticulitis Father   . Atrial fibrillation Mother   . Diabetes Mother   . Hypertension Mother        Current Outpatient Medications (Analgesics):  .  ibuprofen (ADVIL,MOTRIN) 600 MG tablet, Take 1 tablet (600 mg total) by mouth every 6 (six) hours as needed (mild pain).  Current Outpatient Medications (Hematological):  Marland Kitchen  Cyanocobalamin (VITAMIN B 12 PO), Take by mouth.  Current Outpatient Medications (Other):  Marland Kitchen  Cholecalciferol (VITAMIN D) 2000 units CAPS, Take by mouth. .  Pyridoxine HCl (B-6 PO), Take by mouth.    Past medical history, social, surgical and family history all reviewed in electronic medical record.  No pertanent information unless stated regarding to the chief complaint.   Review  of Systems:  No headache, visual changes, nausea, vomiting, diarrhea, constipation, dizziness, abdominal pain, skin rash, fevers, chills, night sweats, weight loss, swollen lymph nodes, body aches, joint swelling, muscle aches, chest pain, shortness of breath, mood changes.   Objective  Blood pressure 110/80, pulse 69, height 5\' 4"  (1.626 m), weight 131 lb (59.4 kg), SpO2 99 %.   General: No apparent distress alert and oriented x3 mood and affect normal, dressed appropriately.  HEENT: Pupils equal, extraocular movements intact  Respiratory: Patient's speak in full sentences and does not appear short of breath  Cardiovascular: No lower extremity edema, non tender, no erythema  Skin: Warm dry intact with no signs of infection or rash on extremities or  on axial skeleton.  Abdomen: Soft nontender  Neuro: Cranial nerves II through XII are intact, neurovascularly intact in all extremities with 2+ DTRs and 2+ pulses.  Lymph: No lymphadenopathy of posterior or anterior cervical chain or axillae bilaterally.  Gait normal with good balance and coordination.  MSK:  Non tender with full range of motion and good stability and symmetric strength and tone of shoulders, elbows, wrist, hip, knee and ankles bilaterally.  Back Exam:  Inspection: Loss of lordosis Motion: Flexion 45 deg, Extension 42 deg, Side Bending to 30 deg bilaterally,  Rotation to 45 deg bilaterally  SLR laying: Negative  XSLR laying: Negative  Palpable tenderness: Tender to palpation the paraspinal musculature.Marland Kitchen FABER: Positive Faber. Sensory change: Gross sensation intact to all lumbar and sacral dermatomes.  Reflexes: 2+ at both patellar tendons, 2+ at achilles tendons, Babinski's downgoing.  Strength at foot  Plantar-flexion: 5/5 Dorsi-flexion: 5/5 Eversion: 5/5 Inversion: 5/5  Leg strength  Quad: 5/5 Hamstring: 5/5 Hip flexor: 5/5 Hip abductors: 5/5  Gait unremarkable.   Osteopathic findings  T10 extended rotated and side bent left L2 flexed rotated and side bent right Sacrum right on right Pelvic shear noted    Impression and Recommendations:     This case required medical decision making of moderate complexity. The above documentation has been reviewed and is accurate and complete [name], DO       Note: This dictation was prepared with Dragon dictation along with smaller phrase technology. Any transcriptional errors that result from this process are unintentional.

## 2018-01-19 NOTE — Patient Instructions (Signed)
Good to see you  Overall not bad Find time for yourself Ice when you need it Remember the tennis balls  Calcium pyruvate 1500mg  daily and may take 2 months to see a drastic improvement.  See me again in 4-6 weeks

## 2018-01-19 NOTE — Assessment & Plan Note (Signed)
Discussed posture and ergonomics.  Responds well to manipulation.  Discussed avoiding certain activities.  Patient is doing better overall.  Follow-up again in 4 to 6 weeks

## 2018-02-19 DIAGNOSIS — F419 Anxiety disorder, unspecified: Secondary | ICD-10-CM | POA: Diagnosis not present

## 2018-02-22 DIAGNOSIS — R51 Headache: Secondary | ICD-10-CM | POA: Diagnosis not present

## 2018-02-22 DIAGNOSIS — M79669 Pain in unspecified lower leg: Secondary | ICD-10-CM | POA: Diagnosis not present

## 2018-02-22 DIAGNOSIS — R6884 Jaw pain: Secondary | ICD-10-CM | POA: Diagnosis not present

## 2018-02-22 DIAGNOSIS — M5416 Radiculopathy, lumbar region: Secondary | ICD-10-CM | POA: Diagnosis not present

## 2018-02-23 ENCOUNTER — Ambulatory Visit: Payer: BLUE CROSS/BLUE SHIELD | Admitting: Family Medicine

## 2018-02-24 ENCOUNTER — Ambulatory Visit: Payer: BLUE CROSS/BLUE SHIELD | Admitting: Family Medicine

## 2018-02-24 ENCOUNTER — Encounter: Payer: Self-pay | Admitting: Family Medicine

## 2018-02-24 VITALS — BP 110/78 | HR 92 | Ht 64.0 in | Wt 132.0 lb

## 2018-02-24 DIAGNOSIS — M999 Biomechanical lesion, unspecified: Secondary | ICD-10-CM

## 2018-02-24 DIAGNOSIS — M5416 Radiculopathy, lumbar region: Secondary | ICD-10-CM | POA: Diagnosis not present

## 2018-02-24 NOTE — Assessment & Plan Note (Addendum)
Decision today to treat with OMT was based on Physical Exam  After verbal consent patient was treated with HVLA, ME, FPR techniques in pelvis, thoracic, lumbar and sacral areas  Patient tolerated the procedure well with improvement in symptoms  Patient given exercises, stretches and lifestyle modifications  See medications in patient instructions if given  Patient will follow up in 4-6 weeks

## 2018-02-24 NOTE — Assessment & Plan Note (Signed)
No radicular symptoms.  No numbness   Patient is doing a little better at this moment.  I do believe that stress is playing a role as well.  Discussed with patient about icing regimen and home exercise.  Follow-up with me again in 4 to 6 weeks

## 2018-02-24 NOTE — Progress Notes (Signed)
[name] [address], [place] [address] Phone: [phone] Subjective:    I Renee [name] am serving as a Education administrator for Dr. Hulan Saas.    CC: Pelvic floor pain  FIE:[id number]  Renee [name] is a 48 y.o. female coming in with complaint of pelvic pain. Ran on the tredmill this past Saturday and states she is painful today.    Patient states though no radiation down the legs or any numbness.  Patient is doing integrative therapies and is noticing some results.  Working on Engineer, building services.  Past Medical History:  Diagnosis Date  . Headache   . Lumbar radiculitis   . Numbness and tingling in left arm    due to overdoing in in the gym with exercising   . Vision disturbance    Past Surgical History:  Procedure Laterality Date  . VAGINAL HYSTERECTOMY N/A 12/02/2016   Procedure: HYSTERECTOMY VAGINAL WITH BILATERAL SALPINGECTOMY;  Surgeon: Dian Queen, MD;  Location: [facility];  Service: Gynecology;  Laterality: N/A;  . WISDOM TOOTH EXTRACTION     Social History   Socioeconomic History  . Marital status: Married    Spouse name: Not on file  . Number of children: 2  . Years of education: Masters  . Highest education level: Not on file  Occupational History  . Occupation: Oceanographer  . Occupation: Chief Strategy Officer  Social Needs  . Financial resource strain: Not on file  . Food insecurity:    Worry: Not on file    Inability: Not on file  . Transportation needs:    Medical: Not on file    Non-medical: Not on file  Tobacco Use  . Smoking status: Never Smoker  . Smokeless tobacco: Never Used  Substance and Sexual Activity  . Alcohol use: No    Alcohol/week: 0.0 standard drinks  . Drug use: No  . Sexual activity: Not on file  Lifestyle  . Physical activity:    Days per week: Not on file    Minutes per session: Not on file  . Stress: Not on file  Relationships  . Social connections:   Talks on phone: Not on file    Gets together: Not on file    Attends religious service: Not on file    Active member of club or organization: Not on file    Attends meetings of clubs or organizations: Not on file    Relationship status: Not on file  Other Topics Concern  . Not on file  Social History Narrative   Lives at home with husband and two sons.   Right-handed.   1 cup caffeine daily.   No Known Allergies Family History  Problem Relation Age of Onset  . Diverticulitis Father   . Atrial fibrillation Mother   . Diabetes Mother   . Hypertension Mother        Current Outpatient Medications (Analgesics):  .  ibuprofen (ADVIL,MOTRIN) 600 MG tablet, Take 1 tablet (600 mg total) by mouth every 6 (six) hours as needed (mild pain).  Current Outpatient Medications (Hematological):  Marland Kitchen  Cyanocobalamin (VITAMIN B 12 PO), Take by mouth.  Current Outpatient Medications (Other):  Marland Kitchen  Cholecalciferol (VITAMIN D) 2000 units CAPS, Take by mouth. .  Pyridoxine HCl (B-6 PO), Take by mouth.    Past medical history, social, surgical and family history all reviewed in electronic medical record.  No pertanent information unless stated regarding to the chief complaint.   Review  of Systems:  No headache, visual changes, nausea, vomiting, diarrhea, constipation, dizziness, abdominal pain, skin rash, fevers, chills, night sweats, weight loss, swollen lymph nodes, body aches, joint swelling, chest pain, shortness of breath, mood changes.  Positive muscle aches  Objective  Blood pressure 110/78, pulse 92, height 5\' 4"  (1.626 m), weight 132 lb (59.9 kg), SpO2 98 %.   General: No apparent distress alert and oriented x3 mood and affect normal, dressed appropriately.  HEENT: Pupils equal, extraocular movements intact  Respiratory: Patient's speak in full sentences and does not appear short of breath  Cardiovascular: No lower extremity edema, non tender, no erythema  Skin: Warm dry intact with no  signs of infection or rash on extremities or on axial skeleton.  Abdomen: Soft nontender  Neuro: Cranial nerves II through XII are intact, neurovascularly intact in all extremities with 2+ DTRs and 2+ pulses.  Lymph: No lymphadenopathy of posterior or anterior cervical chain or axillae bilaterally.  Gait normal with good balance and coordination.  MSK:  Non tender with full range of motion and good stability and symmetric strength and tone of shoulders, elbows, wrist, hip, knee and ankles bilaterally.  Back Exam:  Inspection: mild loss of lordosis  Motion: Flexion 45 deg, Extension 25 deg, Side Bending to 35 deg bilaterally,  Rotation to 45 deg bilaterally  SLR laying: Negative  XSLR laying: Negative  Palpable tenderness: Tender to palpation of the paraspinal musculature lumbar spine left greater than right. FABER: negative. Sensory change: Gross sensation intact to all lumbar and sacral dermatomes.  Reflexes: 2+ at both patellar tendons, 2+ at achilles tendons, Babinski's downgoing.  Strength at foot  Plantar-flexion: 5/5 Dorsi-flexion: 5/5 Eversion: 5/5 Inversion: 5/5  Leg strength  Quad: 5/5 Hamstring: 5/5 Hip flexor: 5/5 Hip abductors: 4/5 but symmetric Gait unremarkable.  Osteopathic findings T3 extended rotated and side bent right inhaled third rib T9 extended rotated and side bent left L2 flexed rotated and side bent right Sacrum right on right Pelvic shear noted   Impression and Recommendations:     This case required medical decision making of moderate complexity. The above documentation has been reviewed and is accurate and complete [name], DO       Note: This dictation was prepared with Dragon dictation along with smaller phrase technology. Any transcriptional errors that result from this process are unintentional.

## 2018-02-24 NOTE — Patient Instructions (Signed)
Good to see you  I think you are doing better Continue what you are doing  Very interested to see how the pyruvate does  See me again in 5-6 weeks

## 2018-03-03 DIAGNOSIS — R6884 Jaw pain: Secondary | ICD-10-CM | POA: Diagnosis not present

## 2018-03-03 DIAGNOSIS — R51 Headache: Secondary | ICD-10-CM | POA: Diagnosis not present

## 2018-03-03 DIAGNOSIS — M5416 Radiculopathy, lumbar region: Secondary | ICD-10-CM | POA: Diagnosis not present

## 2018-03-03 DIAGNOSIS — M79669 Pain in unspecified lower leg: Secondary | ICD-10-CM | POA: Diagnosis not present

## 2018-03-08 DIAGNOSIS — M79669 Pain in unspecified lower leg: Secondary | ICD-10-CM | POA: Diagnosis not present

## 2018-03-08 DIAGNOSIS — R6884 Jaw pain: Secondary | ICD-10-CM | POA: Diagnosis not present

## 2018-03-08 DIAGNOSIS — M5416 Radiculopathy, lumbar region: Secondary | ICD-10-CM | POA: Diagnosis not present

## 2018-03-08 DIAGNOSIS — R51 Headache: Secondary | ICD-10-CM | POA: Diagnosis not present

## 2018-03-15 DIAGNOSIS — R51 Headache: Secondary | ICD-10-CM | POA: Diagnosis not present

## 2018-03-15 DIAGNOSIS — M79669 Pain in unspecified lower leg: Secondary | ICD-10-CM | POA: Diagnosis not present

## 2018-03-15 DIAGNOSIS — R6884 Jaw pain: Secondary | ICD-10-CM | POA: Diagnosis not present

## 2018-03-15 DIAGNOSIS — M5416 Radiculopathy, lumbar region: Secondary | ICD-10-CM | POA: Diagnosis not present

## 2018-03-22 DIAGNOSIS — M5416 Radiculopathy, lumbar region: Secondary | ICD-10-CM | POA: Diagnosis not present

## 2018-03-22 DIAGNOSIS — R51 Headache: Secondary | ICD-10-CM | POA: Diagnosis not present

## 2018-03-22 DIAGNOSIS — M79669 Pain in unspecified lower leg: Secondary | ICD-10-CM | POA: Diagnosis not present

## 2018-03-22 DIAGNOSIS — R6884 Jaw pain: Secondary | ICD-10-CM | POA: Diagnosis not present

## 2018-03-25 IMAGING — DX DG LUMBAR SPINE COMPLETE 4+V
5 series · 5 of 5 positions shown · non-contrast
Comparison: None.

CLINICAL DATA: Lower back and left leg pain for 2 months without
known injury.

EXAM:
LUMBAR SPINE - COMPLETE 4+ VIEW

[l-spine ap]
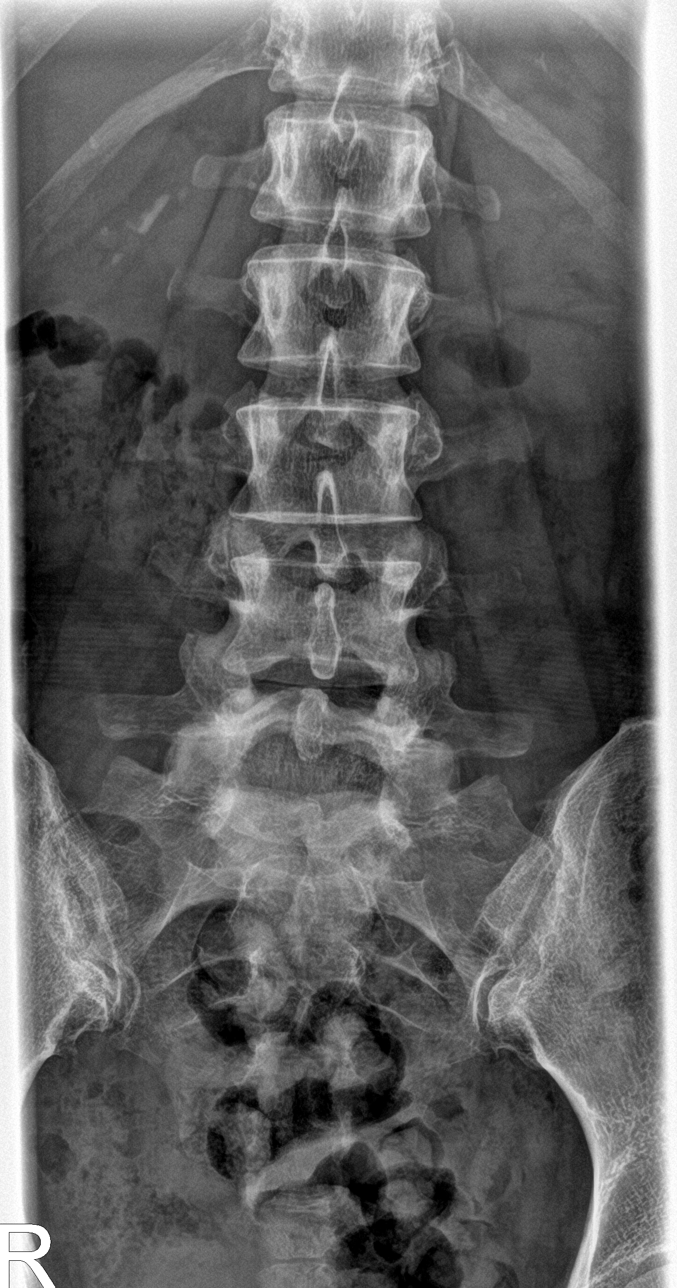

[l-spine obl (1 of 2)]
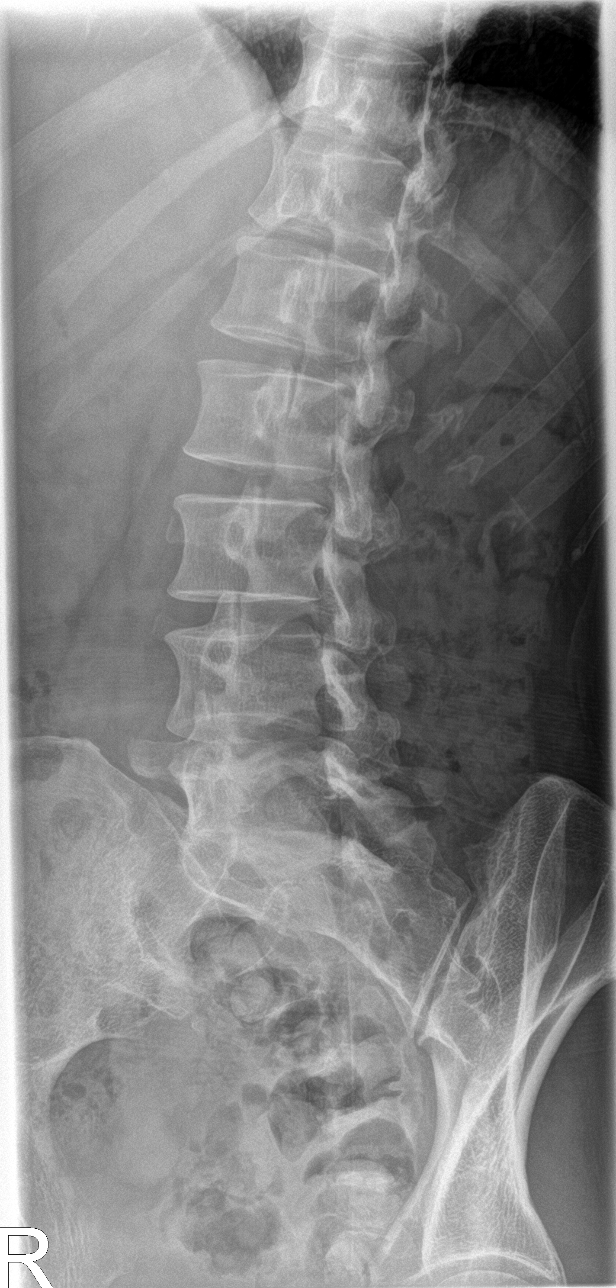

[l-spine obl (2 of 2)]
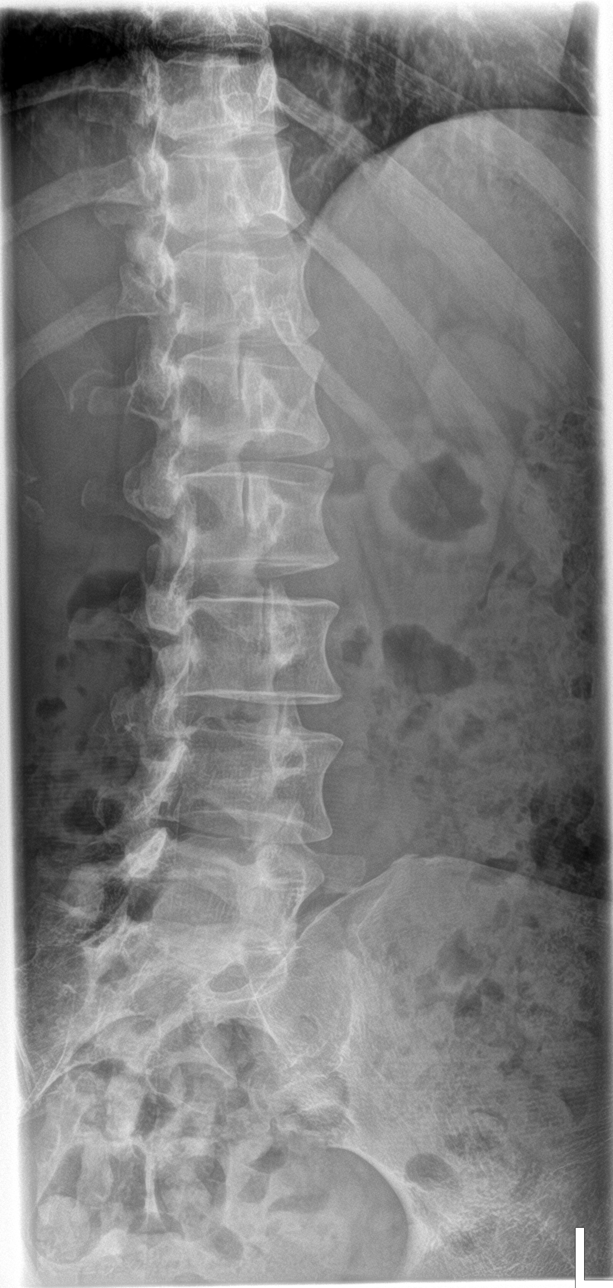

[l-spine lat]
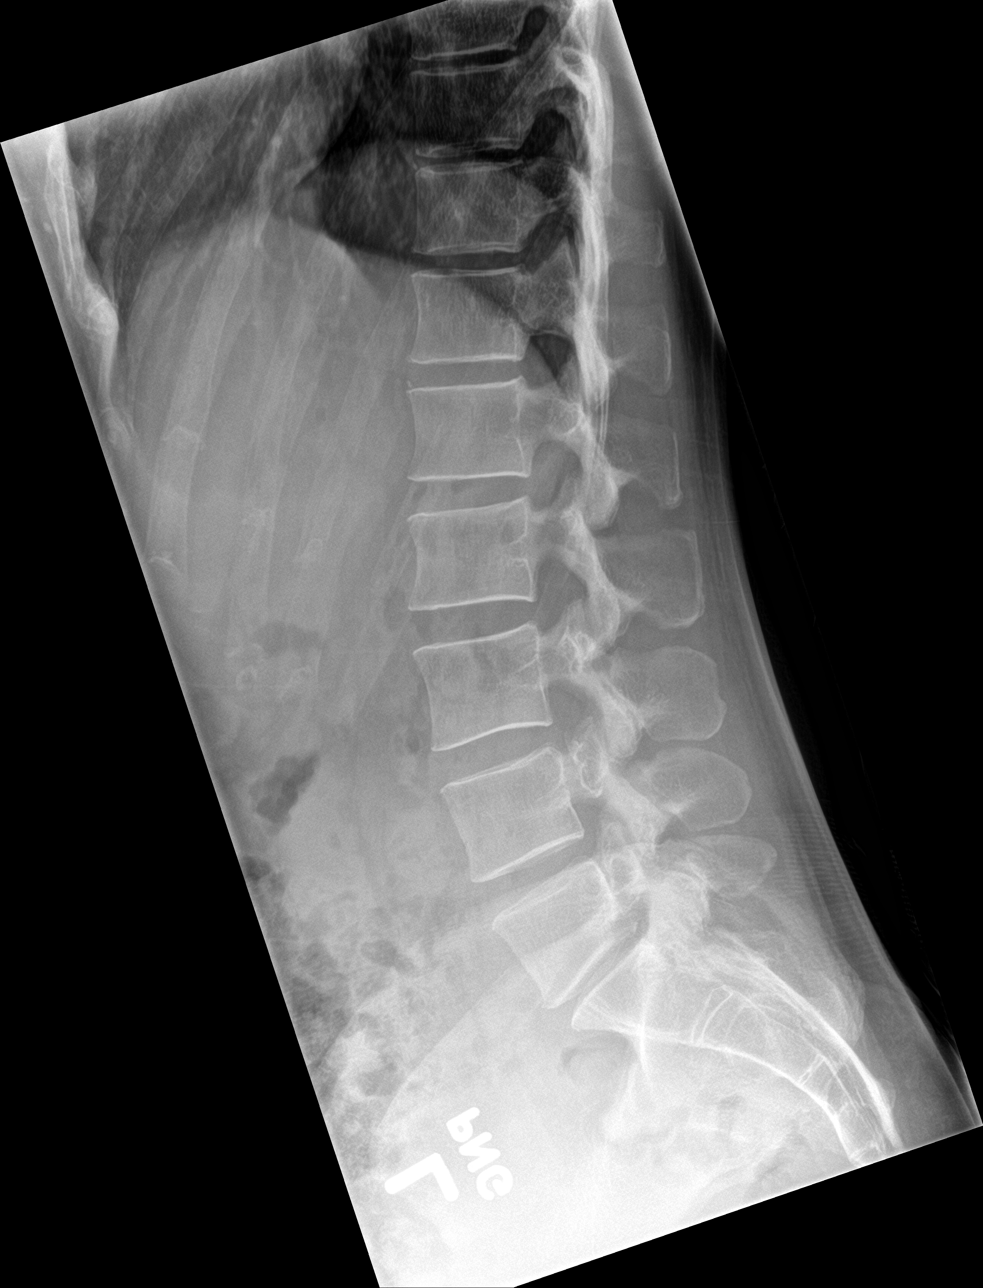

[l-spine spot]
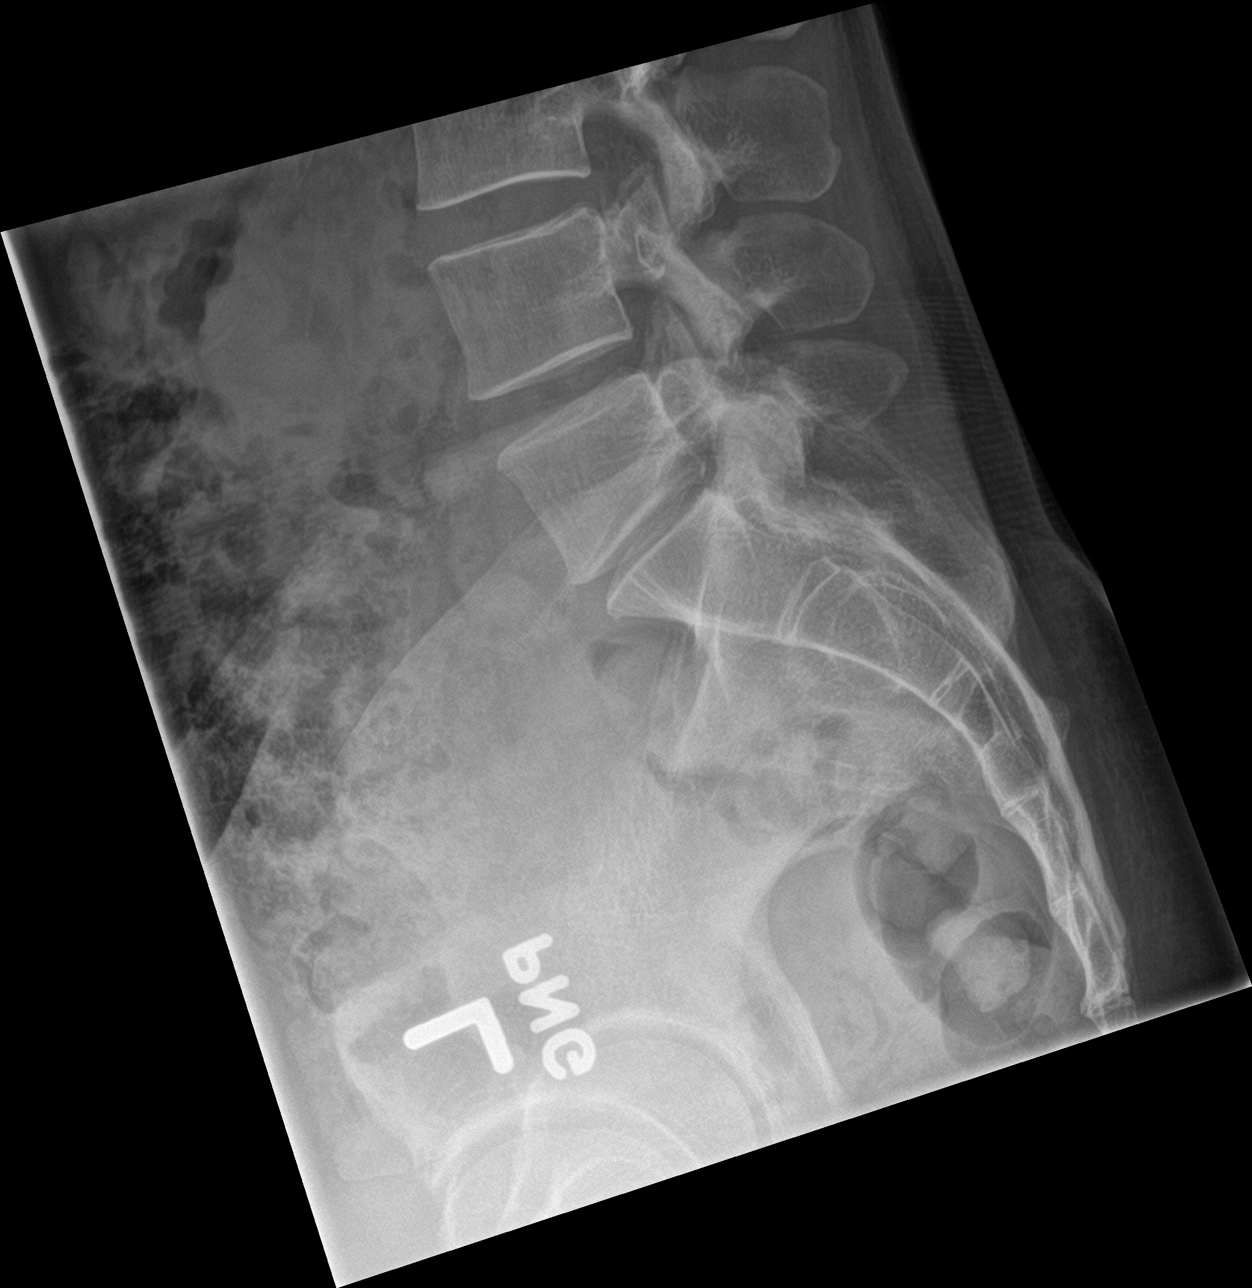

[5 of 5 positions shown; findings below may reference images not displayed]

FINDINGS: There is no evidence of lumbar spine fracture. Alignment is normal.
Intervertebral disc spaces are maintained. Probable mild
degenerative changes seen involving the posterior facet joints of
L5-S1.
IMPRESSION: Probable mild degenerative change seen involving posterior facet
joints of L5-S1. No other significant abnormality seen in the lumbar
spine.

## 2018-03-31 DIAGNOSIS — M79669 Pain in unspecified lower leg: Secondary | ICD-10-CM | POA: Diagnosis not present

## 2018-03-31 DIAGNOSIS — R6884 Jaw pain: Secondary | ICD-10-CM | POA: Diagnosis not present

## 2018-03-31 DIAGNOSIS — R51 Headache: Secondary | ICD-10-CM | POA: Diagnosis not present

## 2018-03-31 DIAGNOSIS — M5416 Radiculopathy, lumbar region: Secondary | ICD-10-CM | POA: Diagnosis not present

## 2018-04-05 DIAGNOSIS — M79669 Pain in unspecified lower leg: Secondary | ICD-10-CM | POA: Diagnosis not present

## 2018-04-05 DIAGNOSIS — R6884 Jaw pain: Secondary | ICD-10-CM | POA: Diagnosis not present

## 2018-04-05 DIAGNOSIS — M5416 Radiculopathy, lumbar region: Secondary | ICD-10-CM | POA: Diagnosis not present

## 2018-04-05 DIAGNOSIS — R51 Headache: Secondary | ICD-10-CM | POA: Diagnosis not present

## 2018-04-06 NOTE — Progress Notes (Signed)
[name] [address] [place], [place] [address] Phone: [phone] Subjective:   Renee [name], am serving as a scribe for Dr. Hulan Saas.  I'm seeing this patient by the request  of:    CC: Back pain follow-up  WEX:[id number]  Renee [name] is a 48 y.o. female coming in with complaint of back pain. She has been doing well since last visit. Has not had any issues since we last saw her.  Patient feels one third drip exercise classes she is doing has been very helpful.  Patient states some mild discomfort from time to time especially when doing new exercises but nothing severe.  Not stopping her from activities.     Past Medical History:  Diagnosis Date  . Headache   . Lumbar radiculitis   . Numbness and tingling in left arm    due to overdoing in in the gym with exercising   . Vision disturbance    Past Surgical History:  Procedure Laterality Date  . VAGINAL HYSTERECTOMY N/A 12/02/2016   Procedure: HYSTERECTOMY VAGINAL WITH BILATERAL SALPINGECTOMY;  Surgeon: Dian Queen, MD;  Location: [facility];  Service: Gynecology;  Laterality: N/A;  . WISDOM TOOTH EXTRACTION     Social History   Socioeconomic History  . Marital status: Married    Spouse name: Not on file  . Number of children: 2  . Years of education: Masters  . Highest education level: Not on file  Occupational History  . Occupation: Oceanographer  . Occupation: Chief Strategy Officer  Social Needs  . Financial resource strain: Not on file  . Food insecurity:    Worry: Not on file    Inability: Not on file  . Transportation needs:    Medical: Not on file    Non-medical: Not on file  Tobacco Use  . Smoking status: Never Smoker  . Smokeless tobacco: Never Used  Substance and Sexual Activity  . Alcohol use: [name]    Alcohol/week: 0.0 standard drinks  . Drug use: [name]  . Sexual activity: Not on file  Lifestyle  . Physical activity:    Days per week: Not  on file    Minutes per session: Not on file  . Stress: Not on file  Relationships  . Social connections:    Talks on phone: Not on file    Gets together: Not on file    Attends religious service: Not on file    Active member of club or organization: Not on file    Attends meetings of clubs or organizations: Not on file    Relationship status: Not on file  Other Topics Concern  . Not on file  Social History Narrative   Lives at home with husband and two sons.   Right-handed.   1 cup caffeine daily.   [name] Known Allergies Family History  Problem Relation Age of Onset  . Diverticulitis Father   . Atrial fibrillation Mother   . Diabetes Mother   . Hypertension Mother        Current Outpatient Medications (Analgesics):  .  ibuprofen (ADVIL,MOTRIN) 600 MG tablet, Take 1 tablet (600 mg total) by mouth every 6 (six) hours as needed (mild pain).  Current Outpatient Medications (Hematological):  Marland Kitchen  Cyanocobalamin (VITAMIN B 12 PO), Take by mouth.  Current Outpatient Medications (Other):  .  busPIRone (BUSPAR) 5 MG tablet, Take 5 mg by mouth 2 (two) times daily. .  Cholecalciferol (VITAMIN D) 2000 units CAPS,  Take by mouth. .  Pyridoxine HCl (B-6 PO), Take by mouth.    Past medical history, social, surgical and family history all reviewed in electronic medical record.  [name] pertanent information unless stated regarding to the chief complaint.   Review of Systems:  [name] headache, visual changes, nausea, vomiting, diarrhea, constipation, dizziness, abdominal pain, skin rash, fevers, chills, night sweats, weight loss, swollen lymph nodes, body aches, joint swelling, muscle aches, chest pain, shortness of breath, mood changes.   Objective  Blood pressure 110/74, pulse 97, height 5\' 4"  (1.626 m), weight 134 lb (60.8 kg), SpO2 99 %.   General: [name] apparent distress alert and oriented x3 mood and affect normal, dressed appropriately.  HEENT: Pupils equal, extraocular movements intact    Respiratory: Patient's speak in full sentences and does not appear short of breath  Cardiovascular: [name] lower extremity edema, non tender, [name] erythema  Skin: Warm dry intact with [name] signs of infection or rash on extremities or on axial skeleton.  Abdomen: Soft nontender  Neuro: Cranial nerves II through XII are intact, neurovascularly intact in all extremities with 2+ DTRs and 2+ pulses.  Lymph: [name] lymphadenopathy of posterior or anterior cervical chain or axillae bilaterally.  Gait normal with good balance and coordination.  MSK:  Non tender with full range of motion and good stability and symmetric strength and tone of shoulders, elbows, wrist, hip, knee and ankles bilaterally.  Back Exam:  Inspection: Unremarkable  Motion: Flexion 45 deg, Extension 25 deg, Side Bending to 35 deg bilaterally,  Rotation to 45 deg bilaterally  SLR laying: Negative  XSLR laying: Negative  Palpable tenderness: Tender to palpation of paraspinal musculature lumbar spine right greater than left. FABER: Positive right. Sensory change: Gross sensation intact to all lumbar and sacral dermatomes.  Reflexes: 2+ at both patellar tendons, 2+ at achilles tendons, Babinski's downgoing.  Strength at foot  Plantar-flexion: 5/5 Dorsi-flexion: 5/5 Eversion: 5/5 Inversion: 5/5  Leg strength  Quad: 5/5 Hamstring: 5/5 Hip flexor: 5/5 Hip abductors: 5/5  Gait unremarkable.  Osteopathic findings T3 extended rotated and side bent right inhaled third rib T9 extended rotated and side bent left L2 flexed rotated and side bent right Sacrum left on left  Pelvic shear noted left   Impression and Recommendations:     This case required medical decision making of moderate complexity. The above documentation has been reviewed and is accurate and complete [name], DO       Note: This dictation was prepared with Dragon dictation along with smaller phrase technology. Any transcriptional errors that result from this  process are unintentional.

## 2018-04-07 ENCOUNTER — Ambulatory Visit: Payer: BLUE CROSS/BLUE SHIELD | Admitting: Family Medicine

## 2018-04-07 ENCOUNTER — Encounter: Payer: Self-pay | Admitting: Family Medicine

## 2018-04-07 VITALS — BP 110/74 | HR 97 | Ht 64.0 in | Wt 134.0 lb

## 2018-04-07 DIAGNOSIS — M999 Biomechanical lesion, unspecified: Secondary | ICD-10-CM | POA: Diagnosis not present

## 2018-04-07 DIAGNOSIS — M5416 Radiculopathy, lumbar region: Secondary | ICD-10-CM | POA: Diagnosis not present

## 2018-04-07 NOTE — Assessment & Plan Note (Signed)
No radicular symptoms.  Still some tightness of the hip flexors. No severe.  Patient is increasing activity slowly over the course of time.  Patient will follow-up with me again 4 to 8 weeks

## 2018-04-07 NOTE — Assessment & Plan Note (Signed)
Decision today to treat with OMT was based on Physical Exam  After verbal consent patient was treated with HVLA, ME, FPR techniques in pelvic, thoracic, lumbar and sacral areas  Patient tolerated the procedure well with improvement in symptoms  Patient given exercises, stretches and lifestyle modifications  See medications in patient instructions if given  Patient will follow up in 4-8 weeks

## 2018-04-07 NOTE — Patient Instructions (Signed)
Good to see you  Ice is your friend Stay active I am impressed Maybe start the pyruvate See me again in 6-8 weeks

## 2018-04-12 DIAGNOSIS — R6884 Jaw pain: Secondary | ICD-10-CM | POA: Diagnosis not present

## 2018-04-12 DIAGNOSIS — M79669 Pain in unspecified lower leg: Secondary | ICD-10-CM | POA: Diagnosis not present

## 2018-04-12 DIAGNOSIS — R51 Headache: Secondary | ICD-10-CM | POA: Diagnosis not present

## 2018-04-12 DIAGNOSIS — M5416 Radiculopathy, lumbar region: Secondary | ICD-10-CM | POA: Diagnosis not present

## 2018-05-31 ENCOUNTER — Ambulatory Visit: Payer: BLUE CROSS/BLUE SHIELD | Admitting: Family Medicine

## 2018-10-07 DIAGNOSIS — Z6822 Body mass index (BMI) 22.0-22.9, adult: Secondary | ICD-10-CM | POA: Diagnosis not present

## 2018-10-07 DIAGNOSIS — Z01419 Encounter for gynecological examination (general) (routine) without abnormal findings: Secondary | ICD-10-CM | POA: Diagnosis not present

## 2018-10-07 DIAGNOSIS — F419 Anxiety disorder, unspecified: Secondary | ICD-10-CM | POA: Diagnosis not present

## 2018-10-07 DIAGNOSIS — F411 Generalized anxiety disorder: Secondary | ICD-10-CM | POA: Diagnosis not present

## 2018-10-07 DIAGNOSIS — R319 Hematuria, unspecified: Secondary | ICD-10-CM | POA: Diagnosis not present

## 2018-10-07 DIAGNOSIS — Z1231 Encounter for screening mammogram for malignant neoplasm of breast: Secondary | ICD-10-CM | POA: Diagnosis not present

## 2018-10-07 DIAGNOSIS — N390 Urinary tract infection, site not specified: Secondary | ICD-10-CM | POA: Diagnosis not present

## 2018-10-07 DIAGNOSIS — E785 Hyperlipidemia, unspecified: Secondary | ICD-10-CM | POA: Diagnosis not present

## 2018-10-07 DIAGNOSIS — R799 Abnormal finding of blood chemistry, unspecified: Secondary | ICD-10-CM | POA: Diagnosis not present

## 2019-02-11 HISTORY — PX: COLONOSCOPY: SHX174

## 2019-05-10 ENCOUNTER — Other Ambulatory Visit: Payer: Self-pay

## 2019-05-10 ENCOUNTER — Ambulatory Visit (INDEPENDENT_AMBULATORY_CARE_PROVIDER_SITE_OTHER): Payer: BC Managed Care – PPO | Admitting: Family Medicine

## 2019-05-10 ENCOUNTER — Encounter: Payer: Self-pay | Admitting: Family Medicine

## 2019-05-10 VITALS — BP 110/70 | HR 59 | Ht 64.0 in | Wt 135.0 lb

## 2019-05-10 DIAGNOSIS — M999 Biomechanical lesion, unspecified: Secondary | ICD-10-CM | POA: Diagnosis not present

## 2019-05-10 DIAGNOSIS — M542 Cervicalgia: Secondary | ICD-10-CM

## 2019-05-10 NOTE — Patient Instructions (Addendum)
Good to see you Keep hands within peripheral vision  You will do great and continue to stay active Look up hydroxyzine and see if you would like any  I am excited to see the food sensitivity  See me again in 6 weeks

## 2019-05-10 NOTE — Assessment & Plan Note (Signed)
Chronic problem : Mild exacerbation  interventions previously, including medication management: Home exercises   Interventions this visit: Started osteopathic manipulation again.  Discussed over-the-counter medications.  Patient declined any type of prescription medications.  We did discuss how this could change medical management.  Discussed the potential for needing advanced imaging.  Underlying anxiety likely contributing to patient's pain and affecting of medication management. We discussed with patient the importance ergonomics, home exercises, icing regimen, and over-the-counter natural products.   Future considerations but will be based on evaluation and next visit: Advanced imaging after if no improvement    Return to clinic: 4-6 weeks

## 2019-05-10 NOTE — Assessment & Plan Note (Signed)
   Decision today to treat with OMT was based on Physical Exam  After verbal consent patient was treated with HVLA, ME, FPR techniques in cervical, thoracic, pelvis, lumbar and sacral areas, all areas are chronic   Patient tolerated the procedure well with improvement in symptoms  Patient given exercises, stretches and lifestyle modifications  See medications in patient instructions if given  Patient will follow up in 4-8 weeks 

## 2019-05-10 NOTE — Progress Notes (Signed)
[facility] [address] [place] Phone: [phone] Subjective:   I Renee [name] am serving as a Education administrator for Dr. Hulan Saas.  This visit occurred during the SARS-CoV-2 public health emergency.  Safety protocols were in place, including screening questions prior to the visit, additional usage of staff PPE, and extensive cleaning of exam room while observing appropriate contact time as indicated for disinfecting solutions.   I'm seeing this patient by the request  of:  Renee Queen, MD  CC: Neck and shoulder pain  [id number]   Renee [name] is a 49 y.o. female coming in with complaint of neck and shoulder pain. Last seen 04/07/2018 for OMT. Patient would like OMT today. Believes she has a pinched nerve. Had a "weird" sensation in the elbow that radiated to the hand. That issue is gone but wants a second look.       Past Medical History:  Diagnosis Date  . Headache   . Lumbar radiculitis   . Numbness and tingling in left arm    due to overdoing in in the gym with exercising   . Vision disturbance    Past Surgical History:  Procedure Laterality Date  . VAGINAL HYSTERECTOMY N/A 12/02/2016   Procedure: HYSTERECTOMY VAGINAL WITH BILATERAL SALPINGECTOMY;  Surgeon: Renee Queen, MD;  Location: [facility];  Service: Gynecology;  Laterality: N/A;  . WISDOM TOOTH EXTRACTION     Social History   Socioeconomic History  . Marital status: Married    Spouse name: Not on file  . Number of children: 2  . Years of education: Masters  . Highest education level: Not on file  Occupational History  . Occupation: Oceanographer  . Occupation: Chief Strategy Officer  Tobacco Use  . Smoking status: Never Smoker  . Smokeless tobacco: Never Used  Substance and Sexual Activity  . Alcohol use: No    Alcohol/week: 0.0 standard drinks  . Drug use: No  . Sexual activity: Not on file  Other Topics Concern  . Not on file    Social History Narrative   Lives at home with husband and two sons.   Right-handed.   1 cup caffeine daily.   Social Determinants of Health   Financial Resource Strain:   . Difficulty of Paying Living Expenses:   Food Insecurity:   . Worried About Charity fundraiser in the Last Year:   . Arboriculturist in the Last Year:   Transportation Needs:   . Film/video editor (Medical):   Marland Kitchen Lack of Transportation (Non-Medical):   Physical Activity:   . Days of Exercise per Week:   . Minutes of Exercise per Session:   Stress:   . Feeling of Stress :   Social Connections:   . Frequency of Communication with Friends and Family:   . Frequency of Social Gatherings with Friends and Family:   . Attends Religious Services:   . Active Member of Clubs or Organizations:   . Attends Archivist Meetings:   Marland Kitchen Marital Status:    No Known Allergies Family History  Problem Relation Age of Onset  . Diverticulitis Father   . Atrial fibrillation Mother   . Diabetes Mother   . Hypertension Mother        Current Outpatient Medications (Analgesics):  .  ibuprofen (ADVIL,MOTRIN) 600 MG tablet, Take 1 tablet (600 mg total) by mouth every 6 (six) hours as needed (mild pain).  Current Outpatient Medications (Hematological):  Marland Kitchen  Cyanocobalamin (VITAMIN B 12 PO), Take by mouth.  Current Outpatient Medications (Other):  .  busPIRone (BUSPAR) 5 MG tablet, Take 5 mg by mouth 2 (two) times daily. .  Cholecalciferol (VITAMIN D) 2000 units CAPS, Take by mouth. .  Pyridoxine HCl (B-6 PO), Take by mouth.   Reviewed prior external information including notes and imaging from  primary care provider As well as notes that were available from care everywhere and other healthcare systems.  Past medical history, social, surgical and family history all reviewed in electronic medical record.  No pertanent information unless stated regarding to the chief complaint.   Review of Systems:  No  headache, visual changes, nausea, vomiting, diarrhea, constipation, dizziness, abdominal pain, skin rash, fevers, chills, night sweats, weight loss, swollen lymph nodes, body aches, joint swelling, chest pain, shortness of breath, mood changes. POSITIVE muscle aches  Objective  Blood pressure 110/70, pulse (!) 59, height 5\' 4"  (1.626 m), weight 135 lb (61.2 kg), SpO2 99 %.   General: No apparent distress alert and oriented x3 mood and affect normal, dressed appropriately.  HEENT: Pupils equal, extraocular movements intact  Respiratory: Patient's speak in full sentences and does not appear short of breath  Cardiovascular: No lower extremity edema, non tender, no erythema  Neuro: Cranial nerves II through XII are intact, neurovascularly intact in all extremities with 2+ DTRs and 2+ pulses.  Gait normal with good balance and coordination.  MSK:   Neck exam does have some mild loss of lordosis, nontender to palpation paraspinal musculature lumbar spine left greater than right.  Tightness noted.  Negative Spurling's.  Low back pain medicine tender to palpation patient does have some irregularity of her pelvis noted bilaterally as well.  No tenderness to palpation of the sacroiliac joint right greater than left.  Negative straight leg test.  Osteopathic findings C2 flexed rotated and side bent right T6 extended rotated and side bent left L2 flexed rotated and side bent right Sacrum right on right Pelvic shear left    Impression and Recommendations:     This case required medical decision making of moderate complexity. The above documentation has been reviewed and is accurate and complete [name], DO       Note: This dictation was prepared with Dragon dictation along with smaller phrase technology. Any transcriptional errors that result from this process are unintentional.

## 2019-06-21 ENCOUNTER — Encounter: Payer: Self-pay | Admitting: Family Medicine

## 2019-06-21 ENCOUNTER — Ambulatory Visit: Payer: BC Managed Care – PPO | Admitting: Family Medicine

## 2019-06-21 ENCOUNTER — Other Ambulatory Visit: Payer: Self-pay

## 2019-06-21 VITALS — BP 110/80 | HR 72 | Ht 64.0 in | Wt 132.0 lb

## 2019-06-21 DIAGNOSIS — M999 Biomechanical lesion, unspecified: Secondary | ICD-10-CM

## 2019-06-21 DIAGNOSIS — M5416 Radiculopathy, lumbar region: Secondary | ICD-10-CM

## 2019-06-21 NOTE — Patient Instructions (Signed)
[employer] for DHEA and other vitamins See me again in 7-8 weeks

## 2019-06-21 NOTE — Assessment & Plan Note (Signed)

## 2019-06-21 NOTE — Progress Notes (Signed)
[place] [address] [place] Phone: [phone] Subjective:   I [name] am serving as a Education administrator for Dr. Hulan Saas.  This visit occurred during the SARS-CoV-2 public health emergency.  Safety protocols were in place, including screening questions prior to the visit, additional usage of staff PPE, and extensive cleaning of exam room while observing appropriate contact time as indicated for disinfecting solutions.   I'm seeing this patient by the request  of:  Dian Queen, MD  CC: Low back pain follow-up  [id number]  Renee [name] is a 49 y.o. female coming in with complaint of back and pelvic pain. Last seen on 05/10/2019 for OMT. Patient states she is doing much better. States she believes she is at 100%.       Past Medical History:  Diagnosis Date  . Headache   . Lumbar radiculitis   . Numbness and tingling in left arm    due to overdoing in in the gym with exercising   . Vision disturbance    Past Surgical History:  Procedure Laterality Date  . VAGINAL HYSTERECTOMY N/A 12/02/2016   Procedure: HYSTERECTOMY VAGINAL WITH BILATERAL SALPINGECTOMY;  Surgeon: Dian Queen, MD;  Location: [facility];  Service: Gynecology;  Laterality: N/A;  . WISDOM TOOTH EXTRACTION     Social History   Socioeconomic History  . Marital status: Married    Spouse name: Not on file  . Number of children: 2  . Years of education: Masters  . Highest education level: Not on file  Occupational History  . Occupation: Oceanographer  . Occupation: Chief Strategy Officer  Tobacco Use  . Smoking status: Never Smoker  . Smokeless tobacco: Never Used  Substance and Sexual Activity  . Alcohol use: No    Alcohol/week: 0.0 standard drinks  . Drug use: No  . Sexual activity: Not on file  Other Topics Concern  . Not on file  Social History Narrative   Lives at home with husband and two sons.   Right-handed.   1 cup  caffeine daily.   Social Determinants of Health   Financial Resource Strain:   . Difficulty of Paying Living Expenses:   Food Insecurity:   . Worried About Charity fundraiser in the Last Year:   . Arboriculturist in the Last Year:   Transportation Needs:   . Film/video editor (Medical):   Marland Kitchen Lack of Transportation (Non-Medical):   Physical Activity:   . Days of Exercise per Week:   . Minutes of Exercise per Session:   Stress:   . Feeling of Stress :   Social Connections:   . Frequency of Communication with Friends and Family:   . Frequency of Social Gatherings with Friends and Family:   . Attends Religious Services:   . Active Member of Clubs or Organizations:   . Attends Archivist Meetings:   Marland Kitchen Marital Status:    No Known Allergies Family History  Problem Relation Age of Onset  . Diverticulitis Father   . Atrial fibrillation Mother   . Diabetes Mother   . Hypertension Mother        Current Outpatient Medications (Analgesics):  .  ibuprofen (ADVIL,MOTRIN) 600 MG tablet, Take 1 tablet (600 mg total) by mouth every 6 (six) hours as needed (mild pain).  Current Outpatient Medications (Hematological):  Marland Kitchen  Cyanocobalamin (VITAMIN B 12 PO), Take by mouth.  Current Outpatient Medications (Other):  .  busPIRone (BUSPAR) 5 MG tablet, Take 5 mg by mouth 2 (two) times daily. .  Cholecalciferol (VITAMIN D) 2000 units CAPS, Take by mouth. .  Pyridoxine HCl (B-6 PO), Take by mouth.   Reviewed prior external information including notes and imaging from  primary care provider As well as notes that were available from care everywhere and other healthcare systems.  Past medical history, social, surgical and family history all reviewed in electronic medical record.  No pertanent information unless stated regarding to the chief complaint.   Review of Systems:  No headache, visual changes, nausea, vomiting, diarrhea, constipation, dizziness, abdominal pain, skin  rash, fevers, chills, night sweats, weight loss, swollen lymph nodes, body aches, joint swelling, chest pain, shortness of breath, mood changes. POSITIVE muscle aches  Objective  Blood pressure 110/80, pulse 72, height 5\' 4"  (1.626 m), weight 132 lb (59.9 kg), SpO2 90 %.   General: No apparent distress alert and oriented x3 mood and affect normal, dressed appropriately.  HEENT: Pupils equal, extraocular movements intact  Respiratory: Patient's speak in full sentences and does not appear short of breath  Cardiovascular: No lower extremity edema, non tender, no erythema  Neuro: Cranial nerves II through XII are intact, neurovascularly intact in all extremities with 2+ DTRs and 2+ pulses.  Gait normal with good balance and coordination.  MSK:  Non tender with full range of motion and good stability and symmetric strength and tone of shoulders, elbows, wrist, hip, knee and ankles bilaterally.  Low back does have some tenderness over the right sacroiliac joint.  Mild positive FABER test.  Negative straight leg test.  Improvement in core strength but still some mild weakness with hip abductors  Osteopathic findings T7 extended rotated and side bent left L1 flexed rotated and side bent right Sacrum right on right    Impression and Recommendations:     This case required medical decision making of moderate complexity. The above documentation has been reviewed and is accurate and complete [name], DO       Note: This dictation was prepared with Dragon dictation along with smaller phrase technology. Any transcriptional errors that result from this process are unintentional.

## 2019-06-21 NOTE — Assessment & Plan Note (Signed)
Chronic, stable.  No radicular symptoms.  Responds well to manipulation.  Follow-up again in 8 weeks

## 2019-08-09 ENCOUNTER — Ambulatory Visit: Payer: BC Managed Care – PPO | Admitting: Family Medicine

## 2019-08-10 ENCOUNTER — Ambulatory Visit: Payer: BC Managed Care – PPO | Admitting: Family Medicine

## 2019-08-10 ENCOUNTER — Other Ambulatory Visit: Payer: Self-pay

## 2019-08-10 ENCOUNTER — Encounter: Payer: Self-pay | Admitting: Family Medicine

## 2019-08-10 VITALS — BP 110/80 | HR 68 | Ht 64.0 in | Wt 133.0 lb

## 2019-08-10 DIAGNOSIS — M542 Cervicalgia: Secondary | ICD-10-CM | POA: Diagnosis not present

## 2019-08-10 DIAGNOSIS — M999 Biomechanical lesion, unspecified: Secondary | ICD-10-CM | POA: Diagnosis not present

## 2019-08-10 NOTE — Progress Notes (Signed)
[place] [address] [place] Phone: [phone] Subjective:   I Renee [name] am serving as a Education administrator for Dr. Hulan Saas.  This visit occurred during the SARS-CoV-2 public health emergency.  Safety protocols were in place, including screening questions prior to the visit, additional usage of staff PPE, and extensive cleaning of exam room while observing appropriate contact time as indicated for disinfecting solutions.   I'm seeing this patient by the request  of:  Dian Queen, MD  CC: Upper back pain  [id number]  Renee [name] is a 49 y.o. female coming in with complaint of back and neck pain. OMT 06/21/2019. Patient states she is a little tight. Would like stretches/exercises.  Patient has been trying to do yoga.  Is very stressful life at the moment.  Patient has ailing parents as well as a elder son who is having difficulty with the college application process.  Medications patient has been prescribed: Muscle relaxer intermittently         Reviewed prior external information including notes and imaging from previsou exam, outside providers and external EMR if available.   As well as notes that were available from care everywhere and other healthcare systems.  Past medical history, social, surgical and family history all reviewed in electronic medical record.  No pertanent information unless stated regarding to the chief complaint.   Past Medical History:  Diagnosis Date  . Headache   . Lumbar radiculitis   . Numbness and tingling in left arm    due to overdoing in in the gym with exercising   . Vision disturbance     No Known Allergies no known drug allergies   Review of Systems:  No headache, visual changes, nausea, vomiting, diarrhea, constipation, dizziness, abdominal pain, skin rash, fevers, chills, night sweats, weight loss, swollen lymph nodes, body aches, joint swelling, chest pain, shortness of breath,  mood changes. POSITIVE muscle aches  Objective  Blood pressure 110/80, pulse 68, height 5\' 4"  (1.626 m), weight 133 lb (60.3 kg), SpO2 98 %.   General: No apparent distress alert and oriented x3 mood and affect normal, dressed appropriately.  HEENT: Pupils equal, extraocular movements intact  Respiratory: Patient's speak in full sentences and does not appear short of breath  Cardiovascular: No lower extremity edema, non tender, no erythema  Neuro: Cranial nerves II through XII are intact, neurovascularly intact in all extremities with 2+ DTRs and 2+ pulses.  Gait normal with good balance and coordination.  MSK:  Non tender with full range of motion and good stability and symmetric strength and tone of shoulders, elbows, wrist, hip, knee and ankles bilaterally.  Back -upper back exam does have some mild loss of lordosis.  Some tender to palpation in the paraspinal musculature lumbar spine.  Mild in the parascapular region right greater than left as well.  Osteopathic findings  C2 flexed rotated and side bent right C6 flexed rotated and side bent left T3 extended rotated and side bent right inhaled rib T9 extended rotated and side bent left L2 flexed rotated and side bent right Sacrum right on right       Assessment and Plan:  Scapular dyskinesis noted.  Likely giving more than neck pain.  We discussed different exercises for more of the strengthening with patient actually improving her range of motion overall.  Patient does respond well to manipulation.  Follow-up again in 4 to 8 weeks mild exacerbation noted of this chronic problem.  Stress  is playing a role as well.  Nonallopathic problems  Decision today to treat with OMT was based on Physical Exam  After verbal consent patient was treated with HVLA, ME, FPR techniques in cervical,  thoracic, lumbar, and sacral  areas  Patient tolerated the procedure well with improvement in symptoms  Patient given exercises, stretches and  lifestyle modifications  See medications in patient instructions if given  Patient will follow up in 4-8 weeks      The above documentation has been reviewed and is accurate and complete [name], DO       Note: This dictation was prepared with Dragon dictation along with smaller phrase technology. Any transcriptional errors that result from this process are unintentional.

## 2019-08-10 NOTE — Patient Instructions (Signed)
Exercise 3 times a week Take deep breaths Massage would be good  See me again in 6 weeks

## 2019-09-21 ENCOUNTER — Other Ambulatory Visit: Payer: Self-pay

## 2019-09-21 ENCOUNTER — Ambulatory Visit (INDEPENDENT_AMBULATORY_CARE_PROVIDER_SITE_OTHER): Payer: BC Managed Care – PPO | Admitting: Family Medicine

## 2019-09-21 ENCOUNTER — Encounter: Payer: Self-pay | Admitting: Family Medicine

## 2019-09-21 VITALS — BP 112/68 | HR 82 | Ht 64.0 in | Wt 136.0 lb

## 2019-09-21 DIAGNOSIS — M999 Biomechanical lesion, unspecified: Secondary | ICD-10-CM | POA: Diagnosis not present

## 2019-09-21 DIAGNOSIS — M5416 Radiculopathy, lumbar region: Secondary | ICD-10-CM | POA: Diagnosis not present

## 2019-09-21 NOTE — Assessment & Plan Note (Signed)
Chronic problem with mild exacerbation.  Patient is not taking any significant medications at this time.  Wants to avoid it as well.  Discussed icing regimen and home exercises, core strengthening again.  Patient was out of town and had significant amount of stress recently that I do think is contributing.  Patient will increase activity slowly.  Follow-up again 4 to 6 weeks

## 2019-09-21 NOTE — Patient Instructions (Addendum)
Good to see you  Find more time for yourself  consider yoga wheel Get back into the routine   See me again in 5-6 weeks

## 2019-09-21 NOTE — Progress Notes (Signed)
[place] Phone: [phone] Subjective:   Renee [name], am serving as a scribe for Dr. Hulan Saas. This visit occurred during the SARS-CoV-2 public health emergency.  Safety protocols were in place, including screening questions prior to the visit, additional usage of staff PPE, and extensive cleaning of exam room while observing appropriate contact time as indicated for disinfecting solutions.   I'm seeing this patient by the request  of:  Renee Queen, MD  CC: Low back pain follow-up  [id number]  Renee [name] is a 49 y.o. female coming in with complaint of back and neck pain. OMT 08/10/2019. Patient states that she went on a trip recently and her pain increased due to a lot of sitting. Continues to have radiating numbness into left foot.  States that this is intermittently, [name] significant weakness though noted.  Medications patient has been prescribed: Ibuprofen intermittently          Reviewed prior external information including notes and imaging from previsou exam, outside providers and external EMR if available.   As well as notes that were available from care everywhere and other healthcare systems.  Past medical history, social, surgical and family history all reviewed in electronic medical record.  [name] pertanent information unless stated regarding to the chief complaint.   Past Medical History:  Diagnosis Date  . Headache   . Lumbar radiculitis   . Numbness and tingling in left arm    due to overdoing in in the gym with exercising   . Vision disturbance     [name] Known Allergies   Review of Systems:  [name] headache, visual changes, nausea, vomiting, diarrhea, constipation, dizziness, abdominal pain, skin rash, fevers, chills, night sweats, weight loss, swollen lymph nodes, body aches, joint swelling, chest pain, shortness of breath, mood changes. POSITIVE muscle aches  Objective    There were [name] vitals taken for this visit.   General: [name] apparent distress alert and oriented x3 mood and affect normal, dressed appropriately.  HEENT: Pupils equal, extraocular movements intact  Respiratory: Patient's speak in full sentences and does not appear short of breath  Cardiovascular: [name] lower extremity edema, non tender, [name] erythema  Neuro: Cranial nerves II through XII are intact, neurovascularly intact in all extremities with 2+ DTRs and 2+ pulses.  Gait normal with good balance and coordination.  MSK:  Non tender with full range of motion and good stability and symmetric strength and tone of shoulders, elbows, wrist, hip, knee and ankles bilaterally.  Back -low back exam does have some mild loss of lordosis, some tenderness to palpation of the paraspinal musculature lumbar spine right greater than left.  Patient does have tightness with straight leg test on the left but [name] true radicular symptoms.  Osteopathic findings  C2 flexed rotated and side bent right T9 extended rotated and side bent left L2 flexed rotated and side bent right Sacrum r left on left Pelvic shear right       Assessment and Plan:  Left lumbar radiculitis Chronic problem with mild exacerbation.  Patient is not taking any significant medications at this time.  Wants to avoid it as well.  Discussed icing regimen and home exercises, core strengthening again.  Patient was out of town and had significant amount of stress recently that I do think is contributing.  Patient will increase activity slowly.  Follow-up again 4 to 6 weeks    Nonallopathic problems  Decision today to treat  with OMT was based on Physical Exam  After verbal consent patient was treated with HVLA, ME, FPR techniques in cervical, rib, thoracic, lumbar, and sacral  areas  Patient tolerated the procedure well with improvement in symptoms  Patient given exercises, stretches and lifestyle modifications  See medications in patient  instructions if given  Patient will follow up in 4-8 weeks      The above documentation has been reviewed and is accurate and complete Renee Pulley, DO       Note: This dictation was prepared with Dragon dictation along with smaller phrase technology. Any transcriptional errors that result from this process are unintentional.

## 2019-11-03 ENCOUNTER — Ambulatory Visit: Payer: BC Managed Care – PPO | Admitting: Family Medicine

## 2019-11-21 ENCOUNTER — Encounter: Payer: Self-pay | Admitting: Family Medicine

## 2019-11-21 ENCOUNTER — Ambulatory Visit: Payer: BC Managed Care – PPO | Admitting: Family Medicine

## 2019-11-21 ENCOUNTER — Other Ambulatory Visit: Payer: Self-pay

## 2019-11-21 VITALS — BP 100/70 | HR 70 | Ht 64.0 in | Wt 130.0 lb

## 2019-11-21 DIAGNOSIS — M5416 Radiculopathy, lumbar region: Secondary | ICD-10-CM

## 2019-11-21 DIAGNOSIS — M999 Biomechanical lesion, unspecified: Secondary | ICD-10-CM | POA: Diagnosis not present

## 2019-11-21 NOTE — Patient Instructions (Signed)
Overall great Massage in 3 weeks See me again in 6 weeks

## 2019-11-21 NOTE — Assessment & Plan Note (Signed)
Patient continues to have some tightness of the hip flexors.  Patient does not have any radicular symptoms which is good.  We discussed posture and ergonomics, which activities to avoid.  Increase activity slowly.  Follow-up again 4 to 8 weeks.

## 2019-11-21 NOTE — Progress Notes (Signed)
[place] [address] [place] Phone: [phone] Subjective:   I [name] am serving as a Education administrator for Dr. Hulan Saas.  This visit occurred during the SARS-CoV-2 public health emergency.  Safety protocols were in place, including screening questions prior to the visit, additional usage of staff PPE, and extensive cleaning of exam room while observing appropriate contact time as indicated for disinfecting solutions.   I'm seeing this patient by the request  of:  Dian Queen, MD  CC: Low back pain follow-up  [id number]  Venessa Wickham is a 49 y.o. female coming in with complaint of back and neck pain. OMT 09/21/2019. Patient states she is doing better today. Recently had a massage.  Patient feels like it has helped a little bit.  Patient since then seemed to do better and had some improvement in range of motion of the neck pressure.  Medications patient has been prescribed:   Taking:         Reviewed prior external information including notes and imaging from previsou exam, outside providers and external EMR if available.   As well as notes that were available from care everywhere and other healthcare systems.  Past medical history, social, surgical and family history all reviewed in electronic medical record.  No pertanent information unless stated regarding to the chief complaint.   Past Medical History:  Diagnosis Date  . Headache   . Lumbar radiculitis   . Numbness and tingling in left arm    due to overdoing in in the gym with exercising   . Vision disturbance     No Known Allergies   Review of Systems:  No headache, visual changes, nausea, vomiting, diarrhea, constipation, dizziness, abdominal pain, skin rash, fevers, chills, night sweats, weight loss, swollen lymph nodes, body aches, joint swelling, chest pain, shortness of breath, mood changes. POSITIVE muscle aches  Objective  Blood pressure 100/70,  pulse 70, height 5\' 4"  (1.626 m), weight 130 lb (59 kg), SpO2 98 %.   General: No apparent distress alert and oriented x3 mood and affect normal, dressed appropriately.  HEENT: Pupils equal, extraocular movements intact  Respiratory: Patient's speak in full sentences and does not appear short of breath  Cardiovascular: No lower extremity edema, non tender, no erythema  Neuro: Cranial nerves II through XII are intact, neurovascularly intact in all extremities with 2+ DTRs and 2+ pulses.  Gait normal with good balance and coordination.  MSK:  Non tender with full range of motion and good stability and symmetric strength and tone of shoulders, elbows, wrist, hip, knee and ankles bilaterally.  Back - Normal skin, Spine with normal alignment and no deformity.  No tenderness to vertebral process palpation.  Paraspinous muscles are not tender and without spasm.   Range of motion is full at neck and lumbar sacral regions  Osteopathic findings   C7 flexed rotated and side bent left T3 extended rotated and side bent right inhaled rib T9 extended rotated and side bent left L3 flexed rotated and side bent right Sacrum right on right       Assessment and Plan:  Left lumbar radiculitis Patient continues to have some tightness of the hip flexors.  Patient does not have any radicular symptoms which is good.  We discussed posture and ergonomics, which activities to avoid.  Increase activity slowly.  Follow-up again 4 to 8 weeks.    Nonallopathic problems  Decision today to treat with OMT was based on Physical  Exam  After verbal consent patient was treated with HVLA, ME, FPR techniques in cervical,  thoracic, lumbar, and sacral  areas  Patient tolerated the procedure well with improvement in symptoms  Patient given exercises, stretches and lifestyle modifications  See medications in patient instructions if given  Patient will follow up in 4-8 weeks      The above documentation has been  reviewed and is accurate and complete [name], DO       Note: This dictation was prepared with Dragon dictation along with smaller phrase technology. Any transcriptional errors that result from this process are unintentional.

## 2019-12-13 ENCOUNTER — Encounter: Payer: Self-pay | Admitting: Internal Medicine

## 2020-01-02 NOTE — Progress Notes (Signed)
[place] [address] [place] Phone: [phone] Subjective:   I Renee [name] am serving as a Education administrator for Dr. Hulan Saas.  This visit occurred during the SARS-CoV-2 public health emergency.  Safety protocols were in place, including screening questions prior to the visit, additional usage of staff PPE, and extensive cleaning of exam room while observing appropriate contact time as indicated for disinfecting solutions.   I'm seeing this patient by the request  of:  Renee Queen, MD  CC: Back pain follow-up  [id number]  Renee [name] is a 49 y.o. female coming in with complaint of back and neck pain. OMT 11/21/2019. Patient states her back is tight.  Seems right greater than left.  Patient denies any radiation of pain.  States that after a lot of activity and then if she sits severe amount of tightness afterwards.  Patient denies as much of the radiation down the legs, but states that sometimes very mild to the hip.  No weakness.  Does respond well to anti-inflammatories.  Medications patient has been prescribed: None           Reviewed prior external information including notes and imaging from previsou exam, outside providers and external EMR if available.   As well as notes that were available from care everywhere and other healthcare systems.  Past medical history, social, surgical and family history all reviewed in electronic medical record.  No pertanent information unless stated regarding to the chief complaint.   Past Medical History:  Diagnosis Date  . Headache   . Lumbar radiculitis   . Numbness and tingling in left arm    due to overdoing in in the gym with exercising   . Vision disturbance     No Known Allergies   Review of Systems:  No headache, visual changes, nausea, vomiting, diarrhea, constipation, dizziness, abdominal pain, skin rash, fevers, chills, night sweats, weight loss, swollen lymph  nodes, body aches, joint swelling, chest pain, shortness of breath, mood changes. POSITIVE muscle aches  Objective  Blood pressure 130/70, pulse 74, height 5\' 4"  (1.626 m), weight 128 lb (58.1 kg), SpO2 98 %.   General: No apparent distress alert and oriented x3 mood and affect normal, dressed appropriately.  HEENT: Pupils equal, extraocular movements intact  Respiratory: Patient's speak in full sentences and does not appear short of breath  Cardiovascular: No lower extremity edema, non tender, no erythema  Neuro: Cranial nerves II through XII are intact, neurovascularly intact in all extremities with 2+ DTRs and 2+ pulses.  Gait normal with good balance and coordination.  Low back exam does show the patient does have some mild tightness noted in the paraspinal musculature of the lumbar spine right greater than left.  Seems to be in the thoracolumbar junction more.  Patient does have tightness of the hip flexors significantly on the right compared to left.  Negative straight leg test.  Very mild tightness with Corky Sox on the right.  Osteopathic findings  C4 flexed rotated and side bent left T9 extended rotated and side bent left L2 flexed rotated and side bent right Sacrum right on right Right-sided pelvic shear      Assessment and Plan:  Left lumbar radiculitis Patient is having more of her hip flexor tightness than anything else at this time.  Seems to be more of a chronic problem.  No true radicular symptoms at this time.  Discussed icing regimen, patient does have ibuprofen for breakthrough, patient does take  that occasionally.  Does not want any other prescription medications at the moment if she can help it.  Follow-up with me again 6 to 8 weeks    Nonallopathic problems  Decision today to treat with OMT was based on Physical Exam  After verbal consent patient was treated with HVLA, ME, FPR techniques in cervical, pelvis, thoracic, lumbar, and sacral  areas  Patient tolerated  the procedure well with improvement in symptoms  Patient given exercises, stretches and lifestyle modifications  See medications in patient instructions if given  Patient will follow up in 4-8 weeks      The above documentation has been reviewed and is accurate and complete [name], DO       Note: This dictation was prepared with Dragon dictation along with smaller phrase technology. Any transcriptional errors that result from this process are unintentional.

## 2020-01-03 ENCOUNTER — Ambulatory Visit: Payer: BC Managed Care – PPO | Admitting: Family Medicine

## 2020-01-03 ENCOUNTER — Encounter: Payer: Self-pay | Admitting: Family Medicine

## 2020-01-03 ENCOUNTER — Other Ambulatory Visit: Payer: Self-pay

## 2020-01-03 VITALS — BP 130/70 | HR 74 | Ht 64.0 in | Wt 128.0 lb

## 2020-01-03 DIAGNOSIS — M999 Biomechanical lesion, unspecified: Secondary | ICD-10-CM

## 2020-01-03 DIAGNOSIS — M5416 Radiculopathy, lumbar region: Secondary | ICD-10-CM | POA: Diagnosis not present

## 2020-01-03 NOTE — Patient Instructions (Signed)
Keep working on hip flexors they are key Happy Thanksgiving! Keep it up! See me again in 5-8 weeks

## 2020-01-03 NOTE — Assessment & Plan Note (Signed)
Patient is having more of her hip flexor tightness than anything else at this time.  Seems to be more of a chronic problem.  No true radicular symptoms at this time.  Discussed icing regimen, patient does have ibuprofen for breakthrough, patient does take that occasionally.  Does not want any other prescription medications at the moment if she can help it.  Follow-up with me again 6 to 8 weeks

## 2020-01-24 ENCOUNTER — Other Ambulatory Visit: Payer: Self-pay

## 2020-01-24 ENCOUNTER — Ambulatory Visit (AMBULATORY_SURGERY_CENTER): Payer: Self-pay | Admitting: *Deleted

## 2020-01-24 VITALS — Ht 64.0 in | Wt 125.0 lb

## 2020-01-24 DIAGNOSIS — Z1211 Encounter for screening for malignant neoplasm of colon: Secondary | ICD-10-CM

## 2020-01-24 NOTE — Progress Notes (Signed)

## 2020-02-06 ENCOUNTER — Telehealth: Payer: Self-pay | Admitting: Internal Medicine

## 2020-02-06 NOTE — Telephone Encounter (Signed)
Patient called and notified that she may take Advil as needed until the 3 hours before colonoscopy. No further questions from the pt.

## 2020-02-06 NOTE — Telephone Encounter (Signed)
Inbound call from patient requesting a call back please.  Wants to know if it is ok to take Advil for headache.

## 2020-02-07 ENCOUNTER — Ambulatory Visit (AMBULATORY_SURGERY_CENTER): Payer: BC Managed Care – PPO | Admitting: Internal Medicine

## 2020-02-07 ENCOUNTER — Other Ambulatory Visit: Payer: Self-pay

## 2020-02-07 ENCOUNTER — Encounter: Payer: Self-pay | Admitting: Internal Medicine

## 2020-02-07 VITALS — BP 122/70 | HR 62 | Temp 98.4°F | Resp 12 | Ht 64.0 in | Wt 125.0 lb

## 2020-02-07 DIAGNOSIS — Z1211 Encounter for screening for malignant neoplasm of colon: Secondary | ICD-10-CM | POA: Diagnosis present

## 2020-02-07 MED ORDER — SODIUM CHLORIDE 0.9 % IV SOLN: 500.0000 mL | Freq: Once | INTRAVENOUS | Status: DC

## 2020-02-07 NOTE — Progress Notes (Signed)
PT taken to PACU. Monitors in place. VSS. Report given to RN. 

## 2020-02-07 NOTE — Op Note (Signed)
[facility] Patient Name: Renee [name] Procedure Date: 02/07/2020 1:23 PM MRN: [id number] Endoscopist: Gatha Mayer , MD Age: 49 Referring MD:  Date of Birth: Dec 07, 1970 Gender: Female Account #: [id number] Procedure:                Colonoscopy Indications:              Screening for colorectal malignant neoplasm, This                            is the patient's first colonoscopy Medicines:                Propofol per Anesthesia, Monitored Anesthesia Care Procedure:                Pre-Anesthesia Assessment:                           - Prior to the procedure, a History and Physical                            was performed, and patient medications and                            allergies were reviewed. The patient's tolerance of                            previous anesthesia was also reviewed. The risks                            and benefits of the procedure and the sedation                            options and risks were discussed with the patient.                            All questions were answered, and informed consent                            was obtained. Prior Anticoagulants: The patient has                            taken no previous anticoagulant or antiplatelet                            agents. ASA Grade Assessment: II - A patient with                            mild systemic disease. After reviewing the risks                            and benefits, the patient was deemed in                            satisfactory condition to undergo the procedure.  After obtaining informed consent, the colonoscope                            was passed under direct vision. Throughout the                            procedure, the patient's blood pressure, pulse, and                            oxygen saturations were monitored continuously. The                            Olympus CF-HQ190 [id number]) Colonoscope was                             introduced through the anus and advanced to the the                            cecum, identified by appendiceal orifice and                            ileocecal valve. The quality of the bowel                            preparation was excellent. The colonoscopy was                            performed without difficulty. The patient tolerated                            the procedure well. The bowel preparation used was                            Miralax via split dose instruction. Scope In: 1:30:25 PM Scope Out: 1:43:59 PM Scope Withdrawal Time: 0 hours 9 minutes 42 seconds  Total Procedure Duration: 0 hours 13 minutes 34 seconds  Findings:                 The perianal and digital rectal examinations were                            normal.                           The colon (entire examined portion) appeared normal.                           No additional abnormalities were found on                            retroflexion. Complications:            No immediate complications. Estimated blood loss:                            None. Estimated Blood Loss:  Estimated blood loss: none. Recommendation:           - Repeat colonoscopy in 10 years for screening                            purposes.                           - Patient has a contact number available for                            emergencies. The signs and symptoms of potential                            delayed complications were discussed with the                            patient. Return to normal activities tomorrow.                            Written discharge instructions were provided to the                            patient.                           - Resume previous diet.                           - Continue present medications. Iva Boop, MD 02/07/2020 1:55:00 PM This report has been signed electronically.

## 2020-02-07 NOTE — Progress Notes (Signed)
Pt's states no medical or surgical changes since previsit or office visit.  CW - vitals 

## 2020-02-07 NOTE — Patient Instructions (Addendum)
Your exam was normal. No polyps or cancer seen.  Next routine colonoscopy or other screening test in 10 years - 2031  Let me know if the right upper abdominal pain is a problem again.  I appreciate the opportunity to care for you. Iva Boop, MD, FACG   YOU HAD AN ENDOSCOPIC PROCEDURE TODAY AT THE [facility]:   Refer to the procedure report that was given to you for any specific questions about what was found during the examination.  If the procedure report does not answer your questions, please call your gastroenterologist to clarify.  If you requested that your care partner not be given the details of your procedure findings, then the procedure report has been included in a sealed envelope for you to review at your convenience later.  YOU SHOULD EXPECT: Some feelings of bloating in the abdomen. Passage of more gas than usual.  Walking can help get rid of the air that was put into your GI tract during the procedure and reduce the bloating. If you had a lower endoscopy (such as a colonoscopy or flexible sigmoidoscopy) you may notice spotting of blood in your stool or on the toilet paper. If you underwent a bowel prep for your procedure, you may not have a normal bowel movement for a few days.  Please Note:  You might notice some irritation and congestion in your nose or some drainage.  This is from the oxygen used during your procedure.  There is no need for concern and it should clear up in a day or so.  SYMPTOMS TO REPORT IMMEDIATELY:   Following lower endoscopy (colonoscopy or flexible sigmoidoscopy):  Excessive amounts of blood in the stool  Significant tenderness or worsening of abdominal pains  Swelling of the abdomen that is new, acute  Fever of 100F or higher  For urgent or emergent issues, a gastroenterologist can be reached at any hour by calling [phone]. Do not use MyChart messaging for urgent concerns.    DIET:  We do recommend a small meal at  first, but then you may proceed to your regular diet.  Drink plenty of fluids but you should avoid alcoholic beverages for 24 hours.  ACTIVITY:  You should plan to take it easy for the rest of today and you should NOT DRIVE or use heavy machinery until tomorrow (because of the sedation medicines used during the test).    FOLLOW UP: Our staff will call the number listed on your records 48-72 hours following your procedure to check on you and address any questions or concerns that you may have regarding the information given to you following your procedure. If we do not reach you, we will leave a message.  We will attempt to reach you two times.  During this call, we will ask if you have developed any symptoms of COVID 19. If you develop any symptoms (ie: fever, flu-like symptoms, shortness of breath, cough etc.) before then, please call [phone].  If you test positive for Covid 19 in the 2 weeks post procedure, please call and report this information to [place].    If any biopsies were taken you will be contacted by phone or by letter within the next 1-3 weeks.  Please call us at [phone] if you have not heard about the biopsies in 3 weeks.    SIGNATURES/CONFIDENTIALITY: You and/or your care partner have signed paperwork which will be entered into your electronic medical record.  These signatures attest to the  fact that that the information above on your After Visit Summary has been reviewed and is understood.  Full responsibility of the confidentiality of this discharge information lies with you and/or your care-partner.

## 2020-02-08 ENCOUNTER — Telehealth: Payer: Self-pay | Admitting: *Deleted

## 2020-02-08 NOTE — Telephone Encounter (Signed)
°  Follow up Call-  Call back number 02/07/2020  Post procedure Call Back phone  # [phone]  Permission to leave phone message Yes  Some recent data might be hidden     Patient questions:  Do you have a fever, pain , or abdominal swelling? No. Pain Score  0 *  Have you tolerated food without any problems? Yes.    Have you been able to return to your normal activities? Yes.    Do you have any questions about your discharge instructions: Diet   No. Medications  No. Follow up visit  No.  Do you have questions or concerns about your Care? No.  Actions: * If pain score is 4 or above: No action needed, pain <4  1. Have you developed a fever since your procedure? NO  2.   Have you had an respiratory symptoms (SOB or cough) since your procedure? NO  3.   Have you tested positive for COVID 19 since your procedure NO  4.   Have you had any family members/close contacts diagnosed with the COVID 19 since your procedure?  NO   If yes to any of these questions please route to Laverna Peace, RN and Karlton Lemon, RN

## 2020-02-15 ENCOUNTER — Ambulatory Visit: Payer: 59 | Admitting: Family Medicine

## 2020-02-15 ENCOUNTER — Encounter: Payer: Self-pay | Admitting: Family Medicine

## 2020-02-15 ENCOUNTER — Other Ambulatory Visit: Payer: Self-pay

## 2020-02-15 VITALS — BP 120/84 | HR 67 | Ht 64.0 in | Wt 128.0 lb

## 2020-02-15 DIAGNOSIS — M999 Biomechanical lesion, unspecified: Secondary | ICD-10-CM

## 2020-02-15 DIAGNOSIS — M5416 Radiculopathy, lumbar region: Secondary | ICD-10-CM | POA: Diagnosis not present

## 2020-02-15 NOTE — Patient Instructions (Signed)
Good to see you Thigh compression sleeve with a lot of activity Avoid excessive extension of the back If needed consider 3 ibuprofen 3 times a day for 3 days when needed See me again in 7-8 weeks

## 2020-02-15 NOTE — Progress Notes (Signed)
[name] [address] [place] [address] Phone: [phone] Subjective:   I Renee [name] am serving as a Neurosurgeon for Dr. Antoine Primas.  This visit occurred during the SARS-CoV-2 public health emergency.  Safety protocols were in place, including screening questions prior to the visit, additional usage of staff PPE, and extensive cleaning of exam room while observing appropriate contact time as indicated for disinfecting solutions.   I'm seeing this patient by the request  of:  Marcelle Overlie, MD  CC: Back pain follow-up  [id number]  Renee [name] is a 50 y.o. female coming in with complaint of back and neck pain. OMT 01/03/2020. Patient states she is doing well today.  Patient has noticed more tightness in the hamstring.  Does not know if it is coming from her back or something knee.  Seems to be worse with extension  Medications patient has been prescribed: None   Patient does have x-rays from 2017 showing some mild degenerative changes of the posterior facet joints at L5-S1 otherwise unremarkable        Reviewed prior external information including notes and imaging from previsou exam, outside providers and external EMR if available.   As well as notes that were available from care everywhere and other healthcare systems.  Past medical history, social, surgical and family history all reviewed in electronic medical record.  No pertanent information unless stated regarding to the chief complaint.   Past Medical History:  Diagnosis Date  . Headache   . Lumbar radiculitis   . Numbness and tingling in left arm    due to overdoing in in the gym with exercising   . Thyroid disease   . Vision disturbance     No Known Allergies   Review of Systems:  No headache, visual changes, nausea, vomiting, diarrhea, constipation, dizziness, abdominal pain, skin rash, fevers, chills, night sweats, weight loss, swollen lymph nodes, body aches,  joint swelling, chest pain, shortness of breath, mood changes. POSITIVE muscle aches  Objective  Blood pressure 120/84, pulse 67, height 5\' 4"  (1.626 m), weight 128 lb (58.1 kg), SpO2 100 %.   General: No apparent distress alert and oriented x3 mood and affect normal, dressed appropriately.  HEENT: Pupils equal, extraocular movements intact  Respiratory: Patient's speak in full sentences and does not appear short of breath  Cardiovascular: No lower extremity edema, non tender, no erythema  Neuro: Cranial nerves II through XII are intact, neurovascularly intact in all extremities with 2+ DTRs and 2+ pulses.  Gait normal with good balance and coordination.  MSK:  Non tender with full range of motion and good stability and symmetric strength and tone of shoulders, elbows, wrist, hip, knee and ankles bilaterally.  Back -no back does have some mild loss of lordosis, tenderness to palpation in the paraspinal musculature L5-S1 right greater than left.  Mild tightness with the right side but the left side straight leg test.  Tightness of the hamstring but no tenderness on palpation  Osteopathic findings  C6 flexed rotated and side bent left T9 extended rotated and side bent left L2 flexed rotated and side bent right Sacrum left on left Pelvic shear left      Assessment and Plan:  Left lumbar radiculitis Still believe some mild left lumbar radiculopathy Patient is having tightness in the hamstring and given new exercises such as the hamstring such as aspirin.  We will see how patient responds.  Patient continues to avoid anti-inflammatories.  We  discussed 3 over-the-counter anti-inflammatories such as ibuprofen 3 times a day for 3 days as needed.  Patient does not do this regularly well.  Follow-up again in 2 to 3 months    Nonallopathic problems  Decision today to treat with OMT was based on Physical Exam  After verbal consent patient was treated with HVLA, ME, FPR techniques in  cervical, pelvis, thoracic, lumbar, and sacral  areas  Patient tolerated the procedure well with improvement in symptoms  Patient given exercises, stretches and lifestyle modifications  See medications in patient instructions if given  Patient will follow up in 4-8 weeks      The above documentation has been reviewed and is accurate and complete [name], DO       Note: This dictation was prepared with Dragon dictation along with smaller phrase technology. Any transcriptional errors that result from this process are unintentional.

## 2020-02-15 NOTE — Assessment & Plan Note (Signed)
Still believe some mild left lumbar radiculopathy Patient is having tightness in the hamstring and given new exercises such as the hamstring such as aspirin.  We will see how patient responds.  Patient continues to avoid anti-inflammatories.  We discussed 3 over-the-counter anti-inflammatories such as ibuprofen 3 times a day for 3 days as needed.  Patient does not do this regularly well.  Follow-up again in 2 to 3 months

## 2020-03-29 ENCOUNTER — Ambulatory Visit: Payer: 59 | Admitting: Family Medicine

## 2020-04-04 ENCOUNTER — Ambulatory Visit: Payer: 59 | Admitting: Family Medicine

## 2020-05-11 NOTE — Progress Notes (Deleted)
  [place] Phone: [phone] Subjective:    I'm seeing this patient by the request  of:  Dian Queen, MD  CC:   [id number]  Harriette Tovey is a 50 y.o. female coming in with complaint of back and neck pain. OMT 02/15/2020. Patient states   Medications patient has been prescribed: None  Taking:         Reviewed prior external information including notes and imaging from previsou exam, outside providers and external EMR if available.   As well as notes that were available from care everywhere and other healthcare systems.  Past medical history, social, surgical and family history all reviewed in electronic medical record.  No pertanent information unless stated regarding to the chief complaint.   Past Medical History:  Diagnosis Date  . Headache   . Lumbar radiculitis   . Numbness and tingling in left arm    due to overdoing in in the gym with exercising   . Thyroid disease   . Vision disturbance     No Known Allergies   Review of Systems:  No headache, visual changes, nausea, vomiting, diarrhea, constipation, dizziness, abdominal pain, skin rash, fevers, chills, night sweats, weight loss, swollen lymph nodes, body aches, joint swelling, chest pain, shortness of breath, mood changes. POSITIVE muscle aches  Objective  There were no vitals taken for this visit.   General: No apparent distress alert and oriented x3 mood and affect normal, dressed appropriately.  HEENT: Pupils equal, extraocular movements intact  Respiratory: Patient's speak in full sentences and does not appear short of breath  Cardiovascular: No lower extremity edema, non tender, no erythema  Neuro: Cranial nerves II through XII are intact, neurovascularly intact in all extremities with 2+ DTRs and 2+ pulses.  Gait normal with good balance and coordination.  MSK:  Non tender with full range of motion and good stability and  symmetric strength and tone of shoulders, elbows, wrist, hip, knee and ankles bilaterally.  Back - Normal skin, Spine with normal alignment and no deformity.  No tenderness to vertebral process palpation.  Paraspinous muscles are not tender and without spasm.   Range of motion is full at neck and lumbar sacral regions  Osteopathic findings  C2 flexed rotated and side bent right T9 extended rotated and side bent left L2 flexed rotated and side bent right Sacrum right on right Pelvic shear noted       Assessment and Plan:    Nonallopathic problems  Decision today to treat with OMT was based on Physical Exam  After verbal consent patient was treated with HVLA, ME, FPR techniques in cervical, rib, thoracic, lumbar, and sacral  areas  Patient tolerated the procedure well with improvement in symptoms  Patient given exercises, stretches and lifestyle modifications  See medications in patient instructions if given  Patient will follow up in 4-8 weeks      The above documentation has been reviewed and is accurate and complete [name], DO       Note: This dictation was prepared with Dragon dictation along with smaller phrase technology. Any transcriptional errors that result from this process are unintentional.

## 2020-05-14 ENCOUNTER — Ambulatory Visit: Payer: 59 | Admitting: Family Medicine

## 2020-05-23 NOTE — Progress Notes (Signed)
[place] Phone: [phone] Subjective:   Renee [name], am serving as a scribe for Dr. Hulan Saas. This visit occurred during the SARS-CoV-2 public health emergency.  Safety protocols were in place, including screening questions prior to the visit, additional usage of staff PPE, and extensive cleaning of exam room while observing appropriate contact time as indicated for disinfecting solutions.   I'm seeing this patient by the request  of:  Renee Queen, MD  CC: neck and back pain   [id number]  Renee [name] is a 50 y.o. female coming in with complaint of back and neck pain. OMT 02/15/2020. 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 [name] new pain since last visit.  Patient has been doing relatively well overall.  Medications patient has been prescribed: None          Reviewed prior external information including notes and imaging from previsou exam, outside providers and external EMR if available.   As well as notes that were available from care everywhere and other healthcare systems.  Past medical history, social, surgical and family history all reviewed in electronic medical record.  [name] pertanent information unless stated regarding to the chief complaint.   Past Medical History:  Diagnosis Date  . Headache   . Lumbar radiculitis   . Numbness and tingling in left arm    due to overdoing in in the gym with exercising   . Thyroid disease   . Vision disturbance     [name] Known Allergies   Review of Systems:  [name] headache, visual changes, nausea, vomiting, diarrhea, constipation, dizziness, abdominal pain, skin rash, fevers, chills, night sweats, weight loss, swollen lymph nodes, body aches, joint swelling, chest pain, shortness of breath, mood changes. POSITIVE muscle aches  Objective  Blood pressure 100/72, pulse 80, height 5\' 4"  (1.626 m), weight 129 lb (58.5 kg), SpO2 99 %.    General: [name] apparent distress alert and oriented x3 mood and affect normal, dressed appropriately.  HEENT: Pupils equal, extraocular movements intact  Respiratory: Patient's speak in full sentences and does not appear short of breath  Cardiovascular: [name] lower extremity edema, non tender, [name] erythema  Gait normal with good balance and coordination.  MSK:  Non tender with full range of motion and good stability and symmetric strength and tone of shoulders, elbows, wrist, hip, knee and ankles bilaterally.  Back -low back exam does have some mild loss of lordosis and some tenderness to palpation of the paraspinal musculature of the lumbar spine.  Mild pelvic shear noted more discomfort on the left than the right.  Mild pain on the left sacroiliac joint  Osteopathic findings  C5 flexed rotated and side bent right T3 extended rotated and side bent right inhaled rib T8 extended rotated and side bent left L2 flexed rotated and side bent right Sacrum left on left Right pelvic shear      Assessment and Plan:  Left lumbar radiculitis [name] true radicular symptoms.  Mild increase in tightness noted.  Seems to be more normal on the left than the right.  Patient given home exercises and icing regimen, which activities will be beneficial.  Patient will be traveling.  Follow-up in 2 months    Nonallopathic problems  Decision today to treat with OMT was based on Physical Exam  After verbal consent patient was treated with HVLA, ME, FPR techniques in cervical, rib, thoracic, lumbar, and sacral and pelvis areas  Patient tolerated  the procedure well with improvement in symptoms  Patient given exercises, stretches and lifestyle modifications  See medications in patient instructions if given  Patient will follow up in 4-8 weeks      The above documentation has been reviewed and is accurate and complete [name], DO       Note: This dictation was prepared with Dragon dictation along  with smaller phrase technology. Any transcriptional errors that result from this process are unintentional.

## 2020-05-24 ENCOUNTER — Ambulatory Visit: Payer: 59 | Admitting: Family Medicine

## 2020-05-24 ENCOUNTER — Encounter: Payer: Self-pay | Admitting: Family Medicine

## 2020-05-24 ENCOUNTER — Other Ambulatory Visit: Payer: Self-pay

## 2020-05-24 VITALS — BP 100/72 | HR 80 | Ht 64.0 in | Wt 129.0 lb

## 2020-05-24 DIAGNOSIS — M9905 Segmental and somatic dysfunction of pelvic region: Secondary | ICD-10-CM

## 2020-05-24 DIAGNOSIS — M9902 Segmental and somatic dysfunction of thoracic region: Secondary | ICD-10-CM | POA: Diagnosis not present

## 2020-05-24 DIAGNOSIS — M9901 Segmental and somatic dysfunction of cervical region: Secondary | ICD-10-CM | POA: Diagnosis not present

## 2020-05-24 DIAGNOSIS — M9904 Segmental and somatic dysfunction of sacral region: Secondary | ICD-10-CM

## 2020-05-24 DIAGNOSIS — M9908 Segmental and somatic dysfunction of rib cage: Secondary | ICD-10-CM

## 2020-05-24 DIAGNOSIS — M5416 Radiculopathy, lumbar region: Secondary | ICD-10-CM

## 2020-05-24 DIAGNOSIS — M9903 Segmental and somatic dysfunction of lumbar region: Secondary | ICD-10-CM | POA: Diagnosis not present

## 2020-05-24 NOTE — Assessment & Plan Note (Addendum)
No true radicular symptoms.  Mild increase in tightness noted.  Seems to be more normal on the left than the right.  Patient given home exercises and icing regimen, which activities will be beneficial.  Patient will be traveling.  Follow-up in 2 months

## 2020-05-24 NOTE — Patient Instructions (Signed)
Tell Mickey hi  See me in 2 months

## 2020-07-23 NOTE — Progress Notes (Signed)
Renee [name] [address] [place] Phone: [phone] Subjective:   I Renee [name] am serving as a Education administrator for Dr. Hulan [name].  This visit occurred during the SARS-CoV-2 public health emergency.  Safety protocols were in place, including screening questions prior to the visit, additional usage of staff PPE, and extensive cleaning of exam room while observing appropriate contact time as indicated for disinfecting solutions.   I'm seeing this patient by the request  of:  Renee Queen, MD  CC: neck and back pain   [id number]  Renee [name] is a 50 y.o. female coming in with complaint of back and neck pain. OMT 05/24/2020. Patient states her left calf is tight. Pain radiating into her foot. States she has bee doing more vigorous exercises and believes she hasn't stretched it enough.   Medications patient has been prescribed: None        Reviewed prior external information including notes and imaging from previsou exam, outside providers and external EMR if available.   As well as notes that were available from care everywhere and other healthcare systems.  Past medical history, social, surgical and family history all reviewed in electronic medical record.  No pertanent information unless stated regarding to the chief complaint.   Past Medical History:  Diagnosis Date   Headache    Lumbar radiculitis    Numbness and tingling in left arm    due to overdoing in in the gym with exercising    Thyroid disease    Vision disturbance     No Known Allergies   Review of Systems:  No headache, visual changes, nausea, vomiting, diarrhea, constipation, dizziness, abdominal pain, skin rash, fevers, chills, night sweats, weight loss, swollen lymph nodes, body aches, joint swelling, chest pain, shortness of breath, mood changes. POSITIVE muscle aches  Objective  Blood pressure 100/82, pulse 71, height 5\' 4"  (1.626 m), weight 129 lb  (58.5 kg), SpO2 100 %.   General: No apparent distress alert and oriented x3 mood and affect normal, dressed appropriately.  HEENT: Pupils equal, extraocular movements intact  Respiratory: Patient's speak in full sentences and does not appear short of breath  Cardiovascular: No lower extremity edema, non tender, no erythema  Gait normal with good balance and coordination.  MSK:  Non tender with full range of motion and good stability and symmetric strength and tone of shoulders, elbows, wrist, hip, knee and ankles bilaterally.  Back - Exam does show some mild tightness noted around the sacroiliac joints seems The left side.  Patient does have a tightness noted in the paraspinal musculature of the lumbar spine on the right side.  Neck exam does have some mild discomfort noted paraspinal musculature on the left side.  Osteopathic findings  C5 flexed rotated and side bent left T3 extended rotated and side bent right inhaled rib T8 extended rotated and side bent left L2 flexed rotated and side bent right Sacrum left on left  Right pelvic shear still noted       Assessment and Plan:  Neck pain Some tightness noted in the neck today.  Discussed posture and ergonomics.  Has responded well to osteopathic manipulation.  Patient is staying very active.  Did not have any problems today with the Pain we will discuss in other problem.  Continue everything else and follow-up with me again within 2 months  Pelvic floor dysfunction Has had dysfunction previously and doing relatively well.  Worked on a course significantly.  Did  have a pelvic shear noted today and we will monitor.  Follow-up again in 6 to 8 weeks   Nonallopathic problems  Decision today to treat with OMT was based on Physical Exam  After verbal consent patient was treated with HVLA, ME, FPR techniques in cervical, rib, thoracic, lumbar, pelvis, and sacral  areas  Patient tolerated the procedure well with improvement in  symptoms  Patient given exercises, stretches and lifestyle modifications  See medications in patient instructions if given  Patient will follow up in 8 weeks     The above documentation has been reviewed and is accurate and complete Renee Pulley, DO        Note: This dictation was prepared with Dragon dictation along with smaller phrase technology. Any transcriptional errors that result from this process are unintentional.

## 2020-07-24 ENCOUNTER — Encounter: Payer: Self-pay | Admitting: Family Medicine

## 2020-07-24 ENCOUNTER — Ambulatory Visit: Payer: 59 | Admitting: Family Medicine

## 2020-07-24 ENCOUNTER — Other Ambulatory Visit: Payer: Self-pay

## 2020-07-24 VITALS — BP 100/82 | HR 71 | Ht 64.0 in | Wt 129.0 lb

## 2020-07-24 DIAGNOSIS — M9905 Segmental and somatic dysfunction of pelvic region: Secondary | ICD-10-CM

## 2020-07-24 DIAGNOSIS — M9904 Segmental and somatic dysfunction of sacral region: Secondary | ICD-10-CM | POA: Diagnosis not present

## 2020-07-24 DIAGNOSIS — M9903 Segmental and somatic dysfunction of lumbar region: Secondary | ICD-10-CM | POA: Diagnosis not present

## 2020-07-24 DIAGNOSIS — M9902 Segmental and somatic dysfunction of thoracic region: Secondary | ICD-10-CM

## 2020-07-24 DIAGNOSIS — M9901 Segmental and somatic dysfunction of cervical region: Secondary | ICD-10-CM

## 2020-07-24 DIAGNOSIS — M542 Cervicalgia: Secondary | ICD-10-CM

## 2020-07-24 DIAGNOSIS — M6289 Other specified disorders of muscle: Secondary | ICD-10-CM

## 2020-07-24 DIAGNOSIS — M9908 Segmental and somatic dysfunction of rib cage: Secondary | ICD-10-CM

## 2020-07-24 NOTE — Assessment & Plan Note (Signed)
Has had dysfunction previously and doing relatively well.  Worked on a course significantly.  Did have a pelvic shear noted today and we will monitor.  Follow-up again in 6 to 8 weeks

## 2020-07-24 NOTE — Assessment & Plan Note (Signed)
Some tightness noted in the neck today.  Discussed posture and ergonomics.  Has responded well to osteopathic manipulation.  Patient is staying very active.  Did not have any problems today with the Pain we will discuss in other problem.  Continue everything else and follow-up with me again within 2 months

## 2020-07-24 NOTE — Patient Instructions (Addendum)
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

## 2020-09-12 ENCOUNTER — Encounter: Payer: Self-pay | Admitting: Family Medicine

## 2020-09-12 ENCOUNTER — Other Ambulatory Visit: Payer: Self-pay

## 2020-09-12 ENCOUNTER — Ambulatory Visit: Payer: 59 | Admitting: Family Medicine

## 2020-09-12 VITALS — BP 110/78 | HR 73 | Ht 64.0 in | Wt 131.0 lb

## 2020-09-12 DIAGNOSIS — M5416 Radiculopathy, lumbar region: Secondary | ICD-10-CM | POA: Diagnosis not present

## 2020-09-12 DIAGNOSIS — M9902 Segmental and somatic dysfunction of thoracic region: Secondary | ICD-10-CM | POA: Diagnosis not present

## 2020-09-12 DIAGNOSIS — M9903 Segmental and somatic dysfunction of lumbar region: Secondary | ICD-10-CM

## 2020-09-12 DIAGNOSIS — M9905 Segmental and somatic dysfunction of pelvic region: Secondary | ICD-10-CM

## 2020-09-12 DIAGNOSIS — M999 Biomechanical lesion, unspecified: Secondary | ICD-10-CM | POA: Diagnosis not present

## 2020-09-12 DIAGNOSIS — M9901 Segmental and somatic dysfunction of cervical region: Secondary | ICD-10-CM

## 2020-09-12 DIAGNOSIS — M9904 Segmental and somatic dysfunction of sacral region: Secondary | ICD-10-CM

## 2020-09-12 NOTE — Patient Instructions (Signed)
Use theragun Keep core strong See me in 6-8 weeks

## 2020-09-12 NOTE — Progress Notes (Signed)
[place] Phone: [phone] Subjective:   [name], am serving as a scribe for Dr. Hulan Saas.  This visit occurred during the SARS-CoV-2 public health emergency.  Safety protocols were in place, including screening questions prior to the visit, additional usage of staff PPE, and extensive cleaning of exam room while observing appropriate contact time as indicated for disinfecting solutions.    I'm seeing this patient by the request  of:  Dian Queen, MD  CC: Back and neck pain follow-up  [id number]  Faydra Schwebel is a 50 y.o. female coming in with complaint of back and neck pain. OMT 07/24/2020. Patient states that she is having nerve pain in L glute since last visit. Does have different sensation, possibly nerve related, over lateral aspect of L foot and calf.   Medications patient has been prescribed: None  Taking:         Reviewed prior external information including notes and imaging from previsou exam, outside providers and external EMR if available.   As well as notes that were available from care everywhere and other healthcare systems.  Past medical history, social, surgical and family history all reviewed in electronic medical record.  No pertanent information unless stated regarding to the chief complaint.   Past Medical History:  Diagnosis Date   Headache    Lumbar radiculitis    Numbness and tingling in left arm    due to overdoing in in the gym with exercising    Thyroid disease    Vision disturbance     No Known Allergies   Review of Systems:  No headache, visual changes, nausea, vomiting, diarrhea, constipation, dizziness, abdominal pain, skin rash, fevers, chills, night sweats, weight loss, swollen lymph nodes, body aches, joint swelling, chest pain, shortness of breath, mood changes. POSITIVE muscle aches  Objective  Blood pressure 110/78, pulse 73, height 5'  4" (1.626 m), weight 131 lb (59.4 kg), SpO2 98 %.   General: No apparent distress alert and oriented x3 mood and affect normal, dressed appropriately.  HEENT: Pupils equal, extraocular movements intact  Respiratory: Patient's speak in full sentences and does not appear short of breath  Cardiovascular: No lower extremity edema, non tender, no erythema  Tenderness over the left piriformis noted.  Negative straight leg test noted today.  Does have some tightness noted with Corky Sox.  Osteopathic findings  C2 flexed rotated and side bent right C6 flexed rotated and side bent left T7 extended rotated and side bent left L2 flexed rotated and side bent right Sacrum left on left Pelvic shear left      Assessment and Plan:  No problem-specific Assessment & Plan notes found for this encounter.   Nonallopathic problems  Decision today to treat with OMT was based on Physical Exam  After verbal consent patient was treated with HVLA, ME, FPR techniques in cervical, pelvis , thoracic, lumbar, and sacral  areas  Patient tolerated the procedure well with improvement in symptoms  Patient given exercises, stretches and lifestyle modifications  See medications in patient instructions if given  Patient will follow up in 4-8 weeks      The above documentation has been reviewed and is accurate and complete [name], DO       Note: This dictation was prepared with Dragon dictation along with smaller phrase technology. Any transcriptional errors that result from this process are unintentional.

## 2020-10-23 NOTE — Progress Notes (Signed)
[place] Phone: [phone] Subjective:   [name], am serving as a scribe for Dr. Hulan Saas. This visit occurred during the SARS-CoV-2 public health emergency.  Safety protocols were in place, including screening questions prior to the visit, additional usage of staff PPE, and extensive cleaning of exam room while observing appropriate contact time as indicated for disinfecting solutions.   I'm seeing this patient by the request  of:  Dian Queen, MD  CC: Neck and back pain follow-up  [id number]  Karem Weedman is a 50 y.o. female coming in with complaint of back and neck pain. OMT 09/12/2020. Patient states that she has good and bad days.  Patient states that overall still more good days than bad days.  Patient is trying to be more active.  Working on core strength.  Medications patient has been prescribed: None  Taking:          Past Medical History:  Diagnosis Date   Headache    Lumbar radiculitis    Numbness and tingling in left arm    due to overdoing in in the gym with exercising    Thyroid disease    Vision disturbance     No Known Allergies   Review of Systems:  No headache, visual changes, nausea, vomiting, diarrhea, constipation, dizziness, abdominal pain, skin rash, fevers, chills, night sweats, weight loss, swollen lymph nodes, body aches, joint swelling, chest pain, shortness of breath, mood changes. POSITIVE muscle aches  Objective  Blood pressure 118/72, pulse 63, height '5\' 4"'$  (1.626 m), weight 131 lb (59.4 kg), SpO2 99 %.   General: No apparent distress alert and oriented x3 mood and affect normal, dressed appropriately.  HEENT: Pupils equal, extraocular movements intact  Respiratory: Patient's speak in full sentences and does not appear short of breath  Cardiovascular: No lower extremity edema, non tender, no erythema  Tightness of the neck as well as the upper  thoracic spine noted on the right side.  Patient also does still have the pain on the left side of the lower back but very mild compared to usual.  Patient does have some guarding noted though secondary to more soreness of the abdominal area because patient did do a workout recently.  Positive strength of all extremities  Osteopathic findings  C5 flexed rotated and side bent left T9 extended rotated and side bent left L2 flexed rotated and side bent right Sacrum left on left Pelvic shear left      Assessment and Plan:  Neck pain Chronic problem still noted.  Some of it is secondary to stress.  Discussed icing regimen and home exercises.  Increase activity slowly.  Follow-up again in 6 to 8 weeks.   Nonallopathic problems  Decision today to treat with OMT was based on Physical Exam  After verbal consent patient was treated with HVLA, ME, FPR techniques in cervical, pelvis, thoracic, lumbar, and sacral  areas  Patient tolerated the procedure well with improvement in symptoms  Patient given exercises, stretches and lifestyle modifications  See medications in patient instructions if given  Patient will follow up in 4-8 weeks     The above documentation has been reviewed and is accurate and complete [name], DO        Note: This dictation was prepared with Dragon dictation along with smaller phrase technology. Any transcriptional errors that result from this process are unintentional.

## 2020-10-24 ENCOUNTER — Encounter: Payer: Self-pay | Admitting: Family Medicine

## 2020-10-24 ENCOUNTER — Ambulatory Visit: Payer: 59 | Admitting: Family Medicine

## 2020-10-24 ENCOUNTER — Other Ambulatory Visit: Payer: Self-pay

## 2020-10-24 VITALS — BP 118/72 | HR 63 | Ht 64.0 in | Wt 131.0 lb

## 2020-10-24 DIAGNOSIS — M9903 Segmental and somatic dysfunction of lumbar region: Secondary | ICD-10-CM

## 2020-10-24 DIAGNOSIS — M9904 Segmental and somatic dysfunction of sacral region: Secondary | ICD-10-CM

## 2020-10-24 DIAGNOSIS — M9901 Segmental and somatic dysfunction of cervical region: Secondary | ICD-10-CM

## 2020-10-24 DIAGNOSIS — M542 Cervicalgia: Secondary | ICD-10-CM

## 2020-10-24 DIAGNOSIS — M9905 Segmental and somatic dysfunction of pelvic region: Secondary | ICD-10-CM | POA: Diagnosis not present

## 2020-10-24 DIAGNOSIS — M9902 Segmental and somatic dysfunction of thoracic region: Secondary | ICD-10-CM | POA: Diagnosis not present

## 2020-10-24 NOTE — Assessment & Plan Note (Signed)
Chronic problem still noted.  Some of it is secondary to stress.  Discussed icing regimen and home exercises.  Increase activity slowly.  Follow-up again in 6 to 8 weeks.

## 2020-10-24 NOTE — Patient Instructions (Signed)
[place] Renee [name] See me in 7-8 weeks

## 2020-12-12 ENCOUNTER — Ambulatory Visit: Payer: 59 | Admitting: Sports Medicine

## 2020-12-12 ENCOUNTER — Other Ambulatory Visit: Payer: Self-pay

## 2020-12-12 ENCOUNTER — Ambulatory Visit: Payer: 59 | Admitting: Family Medicine

## 2020-12-12 VITALS — BP 110/58 | HR 81 | Ht 64.0 in | Wt 126.0 lb

## 2020-12-12 DIAGNOSIS — M9905 Segmental and somatic dysfunction of pelvic region: Secondary | ICD-10-CM

## 2020-12-12 DIAGNOSIS — M9908 Segmental and somatic dysfunction of rib cage: Secondary | ICD-10-CM

## 2020-12-12 DIAGNOSIS — M9902 Segmental and somatic dysfunction of thoracic region: Secondary | ICD-10-CM | POA: Diagnosis not present

## 2020-12-12 DIAGNOSIS — M542 Cervicalgia: Secondary | ICD-10-CM | POA: Diagnosis not present

## 2020-12-12 DIAGNOSIS — M9901 Segmental and somatic dysfunction of cervical region: Secondary | ICD-10-CM

## 2020-12-12 DIAGNOSIS — M9903 Segmental and somatic dysfunction of lumbar region: Secondary | ICD-10-CM

## 2020-12-12 NOTE — Progress Notes (Addendum)
Renee [name] Renee [name] Renee [name] Phone: [phone]   Assessment and Plan:     1. Neck pain 2. Somatic dysfunction of cervical region 3. Somatic dysfunction of thoracic region 4. Somatic dysfunction of lumbar region 5. Somatic dysfunction of pelvic region 6. Somatic dysfunction of rib region -Chronic with exacerbation, subsequent visit - Recurrence of multiple musculoskeletal complaints likely worsening from stress felt from recent family tragedy - Patient has received moderate to significant relief from OMT in the past, so she elected for repeat OMT today.  Tolerated well per note below - Patient prefers to not have cervical HVLA performed.  So this request was adhered to   Decision today to treat with OMT was based on Physical Exam   After verbal consent patient was treated with HVLA (high velocity low amplitude), ME (muscle energy), FPR (flex positional release), ST (soft tissue), PC/PD (Pelvic Compression/ Pelvic Decompression) techniques in cervical, rib, thoracic, lumbar, and pelvic areas. Patient tolerated the procedure well with improvement in symptoms.  Patient educated on potential side effects of soreness and recommended to rest, hydrate, and use Tylenol as needed for pain control.   Pertinent previous records reviewed include previous office note   Follow Up: 2 weeks for repeat OMT.  Can continue more frequent OMT visits for the next several visits while personal life stressors are contributing to musculoskeletal complaints   Subjective:   I, Renee [name], am serving as a Education administrator for Renee [name].  Chief Complaint: Back and neck pain   HPI:   12/12/20 Patient is a 50 year old female presenting with neck and back pain. Patient last saw Renee [name] on 10/24/20 and had an OMT. Today patient states recent traumatic loss in family.   She feels that this b ulcerShe feels that the stressor has contributed to her  musculoskeletal complaints returning. Back, neck, and shoulder pain. Left collar bone if off. Progressively getting better.  Relevant Historical Information: None pertinent  Additional pertinent review of systems negative.  Current Outpatient Medications  Medication Sig Dispense Refill   Cholecalciferol (VITAMIN D) 2000 units CAPS Take by mouth.     Cholecalciferol (VITAMIN D3) 1.25 MG (50000 UT) CAPS Take 5,000 Int'l Units by mouth daily.     Coenzyme Q10 (COQ-10) 100 MG CAPS Take 1 capsule by mouth in the morning and at bedtime.     Cyanocobalamin (VITAMIN B 12 PO) Take by mouth.     ibuprofen (ADVIL,MOTRIN) 600 MG tablet Take 1 tablet (600 mg total) by mouth every 6 (six) hours as needed (mild pain). 30 tablet 0   Magnesium 250 MG TABS Take 1,350 mg by mouth daily.     Nutritional Supplements (DHEA PO) Take 15 mg by mouth daily.     Phytonadione (MEPHYTON PO) Take 150 mcg by mouth daily.     Pregnenolone Micronized (PREGNENOLONE PO) Take 75 mg by mouth 4 (four) times a week.     progesterone (PROMETRIUM) 100 MG capsule Take 100 mg by mouth at bedtime.     Pyridoxine HCl (B-6 PO) Take by mouth.     thyroid (ARMOUR) 30 MG tablet Take 30 mg by mouth daily.     No current facility-administered medications for this visit.      Objective:     Vitals:   12/12/20 1008  BP: (!) 110/58  Pulse: 81  SpO2: 99%  Weight: 126 lb (57.2 kg)  Height: 5\' 4"  (1.626 m)  Body mass index is 21.63 kg/m.    Physical Exam:     General: Well-appearing, cooperative, sitting comfortably in no acute distress.   OMT Physical Exam:  ASIS Compression Test: Positive Right Cervical: TTP paraspinal, C3 RRSL Rib: Bilateral elevated first rib with TTP Thoracic: TTP paraspinal, T4-6 RRSL Lumbar: TTP paraspinal, L2-4 RLSR Pelvis: Right anterior innominate without flare  Electronically signed by:  Renee [name] D.[facility] [name] [facility] 11:16 AM 12/12/20

## 2020-12-12 NOTE — Patient Instructions (Signed)
2 week follow up

## 2020-12-25 NOTE — Progress Notes (Signed)
Renee [name] D.[place] Phone: [phone]   Assessment and Plan:     1. Chronic bilateral thoracic back pain 2. Somatic dysfunction of cervical region 3. Somatic dysfunction of thoracic region 4. Somatic dysfunction of lumbar region 5. Somatic dysfunction of pelvic region 6. Somatic dysfunction of rib region -Chronic with exacerbation, subsequent visit - Recurrence of multiple musculoskeletal complaints likely worsening from stress felt from recent family tragedy - Patient has received moderate to significant relief from OMT in the past, so she elected for repeat OMT today.  Tolerated well per note below - Patient prefers to not have cervical HVLA performed.  So this request was adhered to   Decision today to treat with OMT was based on Physical Exam   After verbal consent patient was treated with HVLA (high velocity low amplitude), ME (muscle energy), FPR (flex positional release), ST (soft tissue), PC/PD (Pelvic Compression/ Pelvic Decompression) techniques in cervical, rib, thoracic, lumbar, and pelvic areas. Patient tolerated the procedure well with improvement in symptoms.  Patient educated on potential side effects of soreness and recommended to rest, hydrate, and use Tylenol as needed for pain control.   Pertinent previous records reviewed include previous office note   Follow Up: 2 weeks for repeat OMT.  Can continue more frequent OMT visits for the next several visits while personal life stressors are contributing to musculoskeletal complaints    Subjective:   I, Renee [name], am serving as a scribe for Dr. Glennon Mac  Chief Complaint: Neck and back pain   HPI:   12/12/20 Patient is a 50 year old female presenting with neck and back pain. Patient last saw Dr. Tamala Julian on 10/24/20 and had an OMT. Today patient states recent traumatic loss in family.   She feels that this b ulcerShe feels that the  stressor has contributed to her musculoskeletal complaints returning. Back, neck, and shoulder pain. Left collar bone if off. Progressively getting better.  12/26/20 Patient states Left shoulder has flared up but its okay today so would like it to be looked at other than that just regular OMT.  Relevant Historical Information: none pertinent   Additional pertinent review of systems negative.  Current Outpatient Medications  Medication Sig Dispense Refill   Cholecalciferol (VITAMIN D) 2000 units CAPS Take by mouth.     Cholecalciferol (VITAMIN D3) 1.25 MG (50000 UT) CAPS Take 5,000 Int'l Units by mouth daily.     Coenzyme Q10 (COQ-10) 100 MG CAPS Take 1 capsule by mouth in the morning and at bedtime.     Cyanocobalamin (VITAMIN B 12 PO) Take by mouth.     ibuprofen (ADVIL,MOTRIN) 600 MG tablet Take 1 tablet (600 mg total) by mouth every 6 (six) hours as needed (mild pain). 30 tablet 0   Magnesium 250 MG TABS Take 1,350 mg by mouth daily.     Nutritional Supplements (DHEA PO) Take 15 mg by mouth daily.     Phytonadione (MEPHYTON PO) Take 150 mcg by mouth daily.     Pregnenolone Micronized (PREGNENOLONE PO) Take 75 mg by mouth 4 (four) times a week.     progesterone (PROMETRIUM) 100 MG capsule Take 100 mg by mouth at bedtime.     Pyridoxine HCl (B-6 PO) Take by mouth.     thyroid (ARMOUR) 30 MG tablet Take 30 mg by mouth daily.     No current facility-administered medications for this visit.      Objective:  Vitals:   12/26/20 1007  BP: 110/70  Pulse: 78  SpO2: 99%  Weight: 127 lb (57.6 kg)  Height: 5\' 4"  (1.626 m)      Body mass index is 21.8 kg/m.    Physical Exam:     General: Well-appearing, cooperative, sitting comfortably in no acute distress.   OMT Physical Exam:  ASIS Compression Test: Positive Right Cervical: TTP paraspinal, C3 RRSL Rib: Bilateral elevated first rib with TTP Thoracic: TTP paraspinal, T4-6 RRSL Lumbar: TTP paraspinal, L2-4 RLSR Pelvis:  Right anterior innominate without flare  Electronically signed by:  Renee [name] D.[facility] 10:33 AM 12/26/20

## 2020-12-26 ENCOUNTER — Other Ambulatory Visit: Payer: Self-pay

## 2020-12-26 ENCOUNTER — Ambulatory Visit: Payer: 59 | Admitting: Sports Medicine

## 2020-12-26 VITALS — BP 110/70 | HR 78 | Ht 64.0 in | Wt 127.0 lb

## 2020-12-26 DIAGNOSIS — M9902 Segmental and somatic dysfunction of thoracic region: Secondary | ICD-10-CM

## 2020-12-26 DIAGNOSIS — G8929 Other chronic pain: Secondary | ICD-10-CM

## 2020-12-26 DIAGNOSIS — M9901 Segmental and somatic dysfunction of cervical region: Secondary | ICD-10-CM | POA: Diagnosis not present

## 2020-12-26 DIAGNOSIS — M9903 Segmental and somatic dysfunction of lumbar region: Secondary | ICD-10-CM | POA: Diagnosis not present

## 2020-12-26 DIAGNOSIS — M9905 Segmental and somatic dysfunction of pelvic region: Secondary | ICD-10-CM

## 2020-12-26 DIAGNOSIS — M546 Pain in thoracic spine: Secondary | ICD-10-CM | POA: Diagnosis not present

## 2020-12-26 DIAGNOSIS — M9908 Segmental and somatic dysfunction of rib cage: Secondary | ICD-10-CM

## 2020-12-26 NOTE — Patient Instructions (Signed)
Good to see you   Follow up in 2-4 weeks for repeat OMT

## 2021-01-08 NOTE — Progress Notes (Signed)
Renee [name] D.[place] Phone: [phone]   Assessment and Plan:     1. Chronic bilateral thoracic back pain 2. Somatic dysfunction of cervical region 3. Somatic dysfunction of thoracic region 4. Somatic dysfunction of lumbar region 5. Somatic dysfunction of pelvic region 6. Somatic dysfunction of rib region -Chronic with exacerbation, subsequent visit - Recurrence of multiple musculoskeletal complaints with most prominent being in low back, SI joints today - Patient prefers to not have HVLA performed on thoracic or cervical regions - Patient has received significant relief with OMT in the past.  Elects for repeat OMT today.  Tolerated well per note below. - Decision today to treat with OMT was based on Physical Exam   After verbal consent patient was treated with ME (muscle energy), FPR (flex positional release), ST (soft tissue), PC/PD (Pelvic Compression/ Pelvic Decompression) techniques in cervical, rib, thoracic, lumbar, and pelvic areas. Patient tolerated the procedure well with improvement in symptoms.  Patient educated on potential side effects of soreness and recommended to rest, hydrate, and use Tylenol as needed for pain control.   Pertinent previous records reviewed include none   Follow Up: 4 weeks for repeat OMT   Subjective:   I, Renee [name], am serving as a scribe for Dr. Glennon Mac  Chief Complaint: neck and back pain   HPI:   12/12/20 Patient is a 50 year old female presenting with neck and back pain. Patient last saw Dr. Tamala Julian on 10/24/20 and had an OMT. Today patient states recent traumatic loss in family.   She feels that this b ulcerShe feels that the stressor has contributed to her musculoskeletal complaints returning. Back, neck, and shoulder pain. Left collar bone if off. Progressively getting better.   12/26/20 Patient states Left shoulder has flared up but its okay today so would  like it to be looked at other than that just regular OMT.  01/09/21 Patient states both SI joints are flared up , collar bone is fine today   Relevant Historical Information: none pertinent   Additional pertinent review of systems negative.  Current Outpatient Medications  Medication Sig Dispense Refill   Cholecalciferol (VITAMIN D) 2000 units CAPS Take by mouth.     Cholecalciferol (VITAMIN D3) 1.25 MG (50000 UT) CAPS Take 5,000 Int'l Units by mouth daily.     Coenzyme Q10 (COQ-10) 100 MG CAPS Take 1 capsule by mouth in the morning and at bedtime.     Cyanocobalamin (VITAMIN B 12 PO) Take by mouth.     ibuprofen (ADVIL,MOTRIN) 600 MG tablet Take 1 tablet (600 mg total) by mouth every 6 (six) hours as needed (mild pain). 30 tablet 0   Magnesium 250 MG TABS Take 1,350 mg by mouth daily.     Nutritional Supplements (DHEA PO) Take 15 mg by mouth daily.     Phytonadione (MEPHYTON PO) Take 150 mcg by mouth daily.     Pregnenolone Micronized (PREGNENOLONE PO) Take 75 mg by mouth 4 (four) times a week.     progesterone (PROMETRIUM) 100 MG capsule Take 100 mg by mouth at bedtime.     Pyridoxine HCl (B-6 PO) Take by mouth.     thyroid (ARMOUR) 30 MG tablet Take 30 mg by mouth daily.     No current facility-administered medications for this visit.      Objective:     Vitals:   01/09/21 1016  BP: 100/60  Pulse: 84  SpO2: 91%  Weight: 127 lb (57.6 kg)  Height: 5\' 4"  (1.626 m)      Body mass index is 21.8 kg/m.    Physical Exam:     General: Well-appearing, cooperative, sitting comfortably in no acute distress.   OMT Physical Exam:  ASIS Compression Test: Positive Right Cervical: TTP paraspinal, C3 RRSL Sacrum: Positive sphinx.  Left on right Thoracic: TTP paraspinal, T4-6 RRSL Lumbar: TTP paraspinal, L2-4 RLSR Pelvis: Right anterior innominate with out flare  Electronically signed by:  Renee [name] D.Renee [name] [facility] 11:09 AM 01/09/21

## 2021-01-09 ENCOUNTER — Ambulatory Visit: Payer: 59 | Admitting: Sports Medicine

## 2021-01-09 ENCOUNTER — Other Ambulatory Visit: Payer: Self-pay

## 2021-01-09 VITALS — BP 100/60 | HR 84 | Ht 64.0 in | Wt 127.0 lb

## 2021-01-09 DIAGNOSIS — G8929 Other chronic pain: Secondary | ICD-10-CM

## 2021-01-09 DIAGNOSIS — M9908 Segmental and somatic dysfunction of rib cage: Secondary | ICD-10-CM

## 2021-01-09 DIAGNOSIS — M9902 Segmental and somatic dysfunction of thoracic region: Secondary | ICD-10-CM

## 2021-01-09 DIAGNOSIS — M546 Pain in thoracic spine: Secondary | ICD-10-CM

## 2021-01-09 DIAGNOSIS — M9901 Segmental and somatic dysfunction of cervical region: Secondary | ICD-10-CM | POA: Diagnosis not present

## 2021-01-09 DIAGNOSIS — M9903 Segmental and somatic dysfunction of lumbar region: Secondary | ICD-10-CM

## 2021-01-09 DIAGNOSIS — M9905 Segmental and somatic dysfunction of pelvic region: Secondary | ICD-10-CM

## 2021-01-09 NOTE — Patient Instructions (Addendum)
Good to see you  4 week follow up   

## 2021-01-30 NOTE — Progress Notes (Signed)
Renee [name] [address] [place] Phone: [phone] Subjective:   Renee [name], am serving as a scribe for Dr. Hulan Saas. This visit occurred during the SARS-CoV-2 public health emergency.  Safety protocols were in place, including screening questions prior to the visit, additional usage of staff PPE, and extensive cleaning of exam room while observing appropriate contact time as indicated for disinfecting solutions.   I'm seeing this patient by the request  of:  Dian Queen, MD  CC: Neck, upper back and low back pain follow-up  [id number]  Renee [name] is a 50 y.o. female coming in with complaint of back and neck pain. OMT 10/23/2020. Last seen by Dr. Glennon Mac for OMT at end of November. Patient states about the same.  Patient continues to have mostly tenderness in the right scapular region. Medications patient has been prescribed: None  Taking:         Reviewed prior external information including notes and imaging from previsou exam, outside providers and external EMR if available.   As well as notes that were available from care everywhere and other healthcare systems.  Past medical history, social, surgical and family history all reviewed in electronic medical record.  No pertanent information unless stated regarding to the chief complaint.   Past Medical History:  Diagnosis Date   Headache    Lumbar radiculitis    Numbness and tingling in left arm    due to overdoing in in the gym with exercising    Thyroid disease    Vision disturbance     No Known Allergies   Review of Systems:  No headache, visual changes, nausea, vomiting, diarrhea, constipation, dizziness, abdominal pain, skin rash, fevers, chills, night sweats, weight loss, swollen lymph nodes, body aches, joint swelling, chest pain, shortness of breath, mood changes. POSITIVE muscle aches  Objective  Blood pressure 110/62, pulse 82, height 5\' 4"   (1.626 m), weight 130 lb (59 kg), SpO2 98 %.   General: No apparent distress alert and oriented x3 mood and affect normal, dressed appropriately.  HEENT: Pupils equal, extraocular movements intact  Respiratory: Patient's speak in full sentences and does not appear short of breath  Cardiovascular: No lower extremity edema, non tender, no erythema  Low back exam does have some mild loss of lordosis.  Tightness with FABER test bilaterally.  Right scapula does have significant tightness noted.  Right shoulder seems to have some mild subluxation and with FPR technique noted to have significant improvement in pain.  Osteopathic findings  C2 flexed rotated and side bent right C6 flexed rotated and side bent left T3 extended rotated and side bent right inhaled rib T9 extended rotated and side bent left L2 flexed rotated and side bent right Sacrum right on right       Assessment and Plan:  Neck pain Increasing neck and upper back pain.  Patient has had difficulty taking care of herself at the moment.  We discussed with patient different treatment options.  Patient is doing well though with massage in the manipulation that we have done previously.  Discussed with patient to increase activity slowly.  Patient will follow up with me again in 4 to 6 weeks and then we will increase patient's duration between office visits as accordingly.  Pelvic floor dysfunction Mild pelvic shear but nothing significant today.  Did respond well to manipulation.   Nonallopathic problems  Decision today to treat with OMT was based on Physical Exam  After  verbal consent patient was treated with HVLA, ME, FPR techniques in cervical, rib, thoracic, lumbar, and sacral  areas  Patient tolerated the procedure well with improvement in symptoms  Patient given exercises, stretches and lifestyle modifications  See medications in patient instructions if given  Patient will follow up in 4-8 weeks      The above  documentation has been reviewed and is accurate and complete [name], DO        Note: This dictation was prepared with Dragon dictation along with smaller phrase technology. Any transcriptional errors that result from this process are unintentional.

## 2021-01-31 ENCOUNTER — Other Ambulatory Visit: Payer: Self-pay

## 2021-01-31 ENCOUNTER — Ambulatory Visit: Payer: 59 | Admitting: Family Medicine

## 2021-01-31 VITALS — BP 110/62 | HR 82 | Ht 64.0 in | Wt 130.0 lb

## 2021-01-31 DIAGNOSIS — M542 Cervicalgia: Secondary | ICD-10-CM | POA: Diagnosis not present

## 2021-01-31 DIAGNOSIS — M9904 Segmental and somatic dysfunction of sacral region: Secondary | ICD-10-CM

## 2021-01-31 DIAGNOSIS — M9902 Segmental and somatic dysfunction of thoracic region: Secondary | ICD-10-CM

## 2021-01-31 DIAGNOSIS — M9903 Segmental and somatic dysfunction of lumbar region: Secondary | ICD-10-CM | POA: Diagnosis not present

## 2021-01-31 DIAGNOSIS — M9901 Segmental and somatic dysfunction of cervical region: Secondary | ICD-10-CM

## 2021-01-31 DIAGNOSIS — M9908 Segmental and somatic dysfunction of rib cage: Secondary | ICD-10-CM | POA: Diagnosis not present

## 2021-01-31 DIAGNOSIS — M6289 Other specified disorders of muscle: Secondary | ICD-10-CM

## 2021-01-31 DIAGNOSIS — M9905 Segmental and somatic dysfunction of pelvic region: Secondary | ICD-10-CM | POA: Diagnosis not present

## 2021-01-31 NOTE — Assessment & Plan Note (Signed)
Mild pelvic shear but nothing significant today.  Did respond well to manipulation.

## 2021-01-31 NOTE — Patient Instructions (Signed)
Good to see you! I think it was the shoulder Happy holidays Know we are always here for you  See you again in 4-6 weeks

## 2021-01-31 NOTE — Assessment & Plan Note (Signed)
Increasing neck and upper back pain.  Patient has had difficulty taking care of herself at the moment.  We discussed with patient different treatment options.  Patient is doing well though with massage in the manipulation that we have done previously.  Discussed with patient to increase activity slowly.  Patient will follow up with me again in 4 to 6 weeks and then we will increase patient's duration between office visits as accordingly.

## 2021-02-27 NOTE — Progress Notes (Signed)
Renee [name] [address] [place] Phone: [phone] Subjective:   Renee [name], am serving as a scribe for Dr. Hulan Saas. This visit occurred during the SARS-CoV-2 public health emergency.  Safety protocols were in place, including screening questions prior to the visit, additional usage of staff PPE, and extensive cleaning of exam room while observing appropriate contact time as indicated for disinfecting solutions.   I'm seeing this patient by the request  of:  Dian Queen, MD  CC: Back and neck pain follow-up  [id number]  Renee [name] is a 51 y.o. female coming in with complaint of back and neck pain. OMT on 01/31/2021. Patient states same as usual.  Patient has had no a lot of tightness.  Still having significant difficulty with pain at night.  Patient is having difficulty eating and sleeping though on a regular basis because of this as well as stress.  Medications patient has been prescribed: None           Reviewed prior external information including notes and imaging from previsou exam, outside providers and external EMR if available.   As well as notes that were available from care everywhere and other healthcare systems.  Past medical history, social, surgical and family history all reviewed in electronic medical record.  No pertanent information unless stated regarding to the chief complaint.   Past Medical History:  Diagnosis Date   Headache    Lumbar radiculitis    Numbness and tingling in left arm    due to overdoing in in the gym with exercising    Thyroid disease    Vision disturbance     No Known Allergies   Review of Systems:  No headache, visual changes, nausea, vomiting, diarrhea, constipation, dizziness, abdominal pain, skin rash, fevers, chills, night sweats, weight loss, swollen lymph nodes, body aches, joint swelling, chest pain, shortness of breath, mood changes. POSITIVE muscle  aches  Objective  Blood pressure 120/74, pulse 80, height 5\' 4"  (1.626 m), weight 131 lb (59.4 kg), SpO2 99 %.   General: No apparent distress alert and oriented x3 mood and affect normal, dressed appropriately.  HEENT: Pupils equal, extraocular movements intact  Respiratory: Patient's speak in full sentences and does not appear short of breath  Cardiovascular: No lower extremity edema, non tender, no erythema  Patient does have loss of lordosis of the neck.  Significant tightness of the parascapular region bilaterally.  Tightness in the thoracolumbar juncture but also at the sacral area.  Patient does have tightness of the hips with range of motion exercises as well.  Out of 5 strength of the lower extremities  Osteopathic findings  C2 flexed rotated and side bent right C6 flexed rotated and side bent left T3 extended rotated and side bent right inhaled rib T7 extended rotated and side bent left L2 flexed rotated and side bent right Sacrum right on right Pelvic shear noted      Assessment and Plan:  Neck pain Neck exam does have some loss of lordosis.  Some tenderness to palpation in the paraspinal musculature patient has had some increasing stress still at this time.  We discussed with patient did take time for herself still.  Continue to do the exercises.  Follow-up again in 6 to 8 weeks.   Nonallopathic problems  Decision today to treat with OMT was based on Physical Exam  After verbal consent patient was treated with HVLA, ME, FPR techniques in cervical, rib, thoracic, lumbar,  and sacral  areas  Patient tolerated the procedure well with improvement in symptoms  Patient given exercises, stretches and lifestyle modifications  See medications in patient instructions if given  Patient will follow up in 4-8 weeks      The above documentation has been reviewed and is accurate and complete Renee Pulley, DO        Note: This dictation was prepared with Dragon  dictation along with smaller phrase technology. Any transcriptional errors that result from this process are unintentional.

## 2021-02-28 ENCOUNTER — Other Ambulatory Visit: Payer: Self-pay

## 2021-02-28 ENCOUNTER — Ambulatory Visit: Payer: 59 | Admitting: Family Medicine

## 2021-02-28 VITALS — BP 120/74 | HR 80 | Ht 64.0 in | Wt 131.0 lb

## 2021-02-28 DIAGNOSIS — M9901 Segmental and somatic dysfunction of cervical region: Secondary | ICD-10-CM

## 2021-02-28 DIAGNOSIS — M9902 Segmental and somatic dysfunction of thoracic region: Secondary | ICD-10-CM | POA: Diagnosis not present

## 2021-02-28 DIAGNOSIS — M9903 Segmental and somatic dysfunction of lumbar region: Secondary | ICD-10-CM | POA: Diagnosis not present

## 2021-02-28 DIAGNOSIS — M9908 Segmental and somatic dysfunction of rib cage: Secondary | ICD-10-CM | POA: Diagnosis not present

## 2021-02-28 DIAGNOSIS — M542 Cervicalgia: Secondary | ICD-10-CM | POA: Diagnosis not present

## 2021-02-28 DIAGNOSIS — M9905 Segmental and somatic dysfunction of pelvic region: Secondary | ICD-10-CM

## 2021-02-28 DIAGNOSIS — M9904 Segmental and somatic dysfunction of sacral region: Secondary | ICD-10-CM

## 2021-02-28 NOTE — Patient Instructions (Signed)
Tart cherry 1200-2400mg  Try to get quality sleep See you again in 4-5 weeks

## 2021-02-28 NOTE — Assessment & Plan Note (Signed)
Neck exam does have some loss of lordosis.  Some tenderness to palpation in the paraspinal musculature patient has had some increasing stress still at this time.  We discussed with patient did take time for herself still.  Continue to do the exercises.  Follow-up again in 6 to 8 weeks.

## 2021-03-27 ENCOUNTER — Ambulatory Visit: Payer: 59 | Admitting: Family Medicine

## 2021-03-28 NOTE — Progress Notes (Signed)
[place] Phone: [phone] Subjective:   Renee Renee [name], am serving as a scribe for Dr. Hulan Saas.  This visit occurred during the SARS-CoV-2 public health emergency.  Safety protocols were in place, including screening questions prior to the visit, additional usage of staff PPE, and extensive cleaning of exam room while observing appropriate contact time as indicated for disinfecting solutions.    I'm seeing this patient by the request  of:  Dian Queen, MD  CC: Neck and low back pain  [id number]  Renee Renee [name] is a 51 y.o. female coming in with complaint of back and neck pain. OMT on 02/28/2021. Patient states that she has been doing ok. Patient having pain in L pec and L scapula. Feels she might have slept wrong over past 2-3 mornings. Pain dissipates with movement.  Patient seems to get better actually with activity.  Patient denies any diaphoresis, any shortness of breath.  Patient did have a bout with COVID 2 weeks ago.  Medications patient has been prescribed: None  Taking:         Reviewed prior external information including notes and imaging from previsou exam, outside providers and external EMR if available.   As well as notes that were available from care everywhere and other healthcare systems.  Past medical history, social, surgical and family history all reviewed in electronic medical record.  Renee [name] pertanent information unless stated regarding to the chief complaint.   Past Medical History:  Diagnosis Date   Headache    Lumbar radiculitis    Numbness and tingling in left arm    due to overdoing in in the gym with exercising    Thyroid disease    Vision disturbance     Renee [name] Known Allergies   Review of Systems:  Renee [name] headache, visual changes, nausea, vomiting, diarrhea, constipation, dizziness, abdominal pain, skin rash, fevers, chills, night sweats, weight loss, swollen lymph  nodes, body aches, joint swelling, chest pain, shortness of breath, mood changes. POSITIVE muscle aches  Objective  Blood pressure 100/60, pulse 83, height 5\' 4"  (1.626 m), weight 128 lb (58.1 kg), SpO2 99 %.   General: Renee [name] apparent distress alert and oriented x3 mood and affect normal, dressed appropriately.  HEENT: Pupils equal, extraocular movements intact  Respiratory: Patient's speak in full sentences and does not appear short of breath  Cardiovascular: Renee [name] lower extremity edema, non tender, Renee [name] erythema  Neck exam does show significant tightness more on the left side of the neck.  Patient does have paraspinal musculature tightness noted.  Osteopathic findings  C6 flexed rotated and side bent left T3 extended rotated and side bent left inhaled rib T8 extended rotated and side bent left L2 flexed rotated and side bent right Sacrum right on right Pelvic shear noted      Assessment and Plan:  Left lumbar radiculitis Likely Renee [name] significant radicular symptoms, increasing tightness of the neck in the upper back.  We discussed icing regimen and home exercise, discussed which activities to do which wants to avoid.  Patient has started acupuncture and hopefully that will be more beneficial as well.  Follow-up again in 4 to 6 weeks otherwise  Neck pain Chronic, with mild exacerbation.  Did have a viral illness that likely contributed to patient's sleeping in a different environment that probably contributed to more of the discomfort and pain.  Discussed icing regimen and home exercises follow-up again in 6 4 to 6 weeks  Nonallopathic problems  Decision today to treat with OMT was based on Physical Exam  After verbal consent patient was treated with HVLA, ME, FPR techniques in cervical, rib, thoracic, lumbar, and sacral  areas  Patient tolerated the procedure well with improvement in symptoms  Patient given exercises, stretches and lifestyle modifications  See medications in patient  instructions if given  Patient will follow up in 4-8 weeks      The above documentation has been reviewed and is accurate and complete Renee Pulley, DO        Note: This dictation was prepared with Dragon dictation along with smaller phrase technology. Any transcriptional errors that result from this process are unintentional.

## 2021-04-02 ENCOUNTER — Ambulatory Visit: Payer: 59 | Admitting: Family Medicine

## 2021-04-02 ENCOUNTER — Other Ambulatory Visit: Payer: Self-pay

## 2021-04-02 VITALS — BP 100/60 | HR 83 | Ht 64.0 in | Wt 128.0 lb

## 2021-04-02 DIAGNOSIS — M5416 Radiculopathy, lumbar region: Secondary | ICD-10-CM

## 2021-04-02 DIAGNOSIS — M9904 Segmental and somatic dysfunction of sacral region: Secondary | ICD-10-CM | POA: Diagnosis not present

## 2021-04-02 DIAGNOSIS — M542 Cervicalgia: Secondary | ICD-10-CM

## 2021-04-02 DIAGNOSIS — M9903 Segmental and somatic dysfunction of lumbar region: Secondary | ICD-10-CM

## 2021-04-02 DIAGNOSIS — M9902 Segmental and somatic dysfunction of thoracic region: Secondary | ICD-10-CM | POA: Diagnosis not present

## 2021-04-02 DIAGNOSIS — M9908 Segmental and somatic dysfunction of rib cage: Secondary | ICD-10-CM | POA: Diagnosis not present

## 2021-04-02 DIAGNOSIS — M9901 Segmental and somatic dysfunction of cervical region: Secondary | ICD-10-CM | POA: Diagnosis not present

## 2021-04-02 NOTE — Assessment & Plan Note (Addendum)
Chronic, with mild exacerbation.  Did have a viral illness that likely contributed to patient's sleeping in a different environment that probably contributed to more of the discomfort and pain.  Discussed icing regimen and home exercises follow-up again in 6 4 to 6 weeks with patient having COVID though we did discuss if the left-sided pain get worse or to seek medical attention.

## 2021-04-02 NOTE — Patient Instructions (Signed)
Acupuncture is fine 81mg  of Aspirin daily for 4 weeks See me again in 4-6 weeks

## 2021-04-02 NOTE — Assessment & Plan Note (Signed)
Likely no significant radicular symptoms, increasing tightness of the neck in the upper back.  We discussed icing regimen and home exercise, discussed which activities to do which wants to avoid.  Patient has started acupuncture and hopefully that will be more beneficial as well.  Follow-up again in 4 to 6 weeks otherwise

## 2021-04-30 NOTE — Progress Notes (Deleted)
?  Charlann Boxer D.O. ?[facility] ?[place] ?Phone: [phone] ?Subjective:   ? ?I'm seeing this patient by the request  of:  Dian Queen, MD ? ?CC:  ? ?[id number]  ?Isabellamarie Randa is a 51 y.o. female coming in with complaint of back and neck pain. OMT 04/02/2021. Patient states  ? ?Medications patient has been prescribed: None ? ?Taking: ? ? ?  ? ? ? ? ?Reviewed prior external information including notes and imaging from previsou exam, outside providers and external EMR if available.  ? ?As well as notes that were available from care everywhere and other healthcare systems. ? ?Past medical history, social, surgical and family history all reviewed in electronic medical record.  No pertanent information unless stated regarding to the chief complaint.  ? ?Past Medical History:  ?Diagnosis Date  ? Headache   ? Lumbar radiculitis   ? Numbness and tingling in left arm   ? due to overdoing in in the gym with exercising   ? Thyroid disease   ? Vision disturbance   ?  ?No Known Allergies ? ? ?Review of Systems: ? No headache, visual changes, nausea, vomiting, diarrhea, constipation, dizziness, abdominal pain, skin rash, fevers, chills, night sweats, weight loss, swollen lymph nodes, body aches, joint swelling, chest pain, shortness of breath, mood changes. POSITIVE muscle aches ? ?Objective  ?There were no vitals taken for this visit. ?  ?General: No apparent distress alert and oriented x3 mood and affect normal, dressed appropriately.  ?HEENT: Pupils equal, extraocular movements intact  ?Respiratory: Patient's speak in full sentences and does not appear short of breath  ?Cardiovascular: No lower extremity edema, non tender, no erythema  ?Neuro: Cranial nerves II through XII are intact, neurovascularly intact in all extremities with 2+ DTRs and 2+ pulses.  ?Gait normal with good balance and coordination.  ?MSK:  Non tender with full range of motion and good stability and  symmetric strength and tone of shoulders, elbows, wrist, hip, knee and ankles bilaterally.  ?Back - Normal skin, Spine with normal alignment and no deformity.  No tenderness to vertebral process palpation.  Paraspinous muscles are not tender and without spasm.   Range of motion is full at neck and lumbar sacral regions ? ?Osteopathic findings ? ?C2 flexed rotated and side bent right ?C6 flexed rotated and side bent left ?T3 extended rotated and side bent right inhaled rib ?T9 extended rotated and side bent left ?L2 flexed rotated and side bent right ?Sacrum right on right ? ? ? ? ?  ?Assessment and Plan: ? ? ? ?Nonallopathic problems ? ?Decision today to treat with OMT was based on Physical Exam ? ?After verbal consent patient was treated with HVLA, ME, FPR techniques in cervical, rib, thoracic, lumbar, and sacral  areas ? ?Patient tolerated the procedure well with improvement in symptoms ? ?Patient given exercises, stretches and lifestyle modifications ? ?See medications in patient instructions if given ? ?Patient will follow up in 4-8 weeks ? ?  ? ? ?The above documentation has been reviewed and is accurate and complete [name] ? ? ?  ? ? Note: This dictation was prepared with Dragon dictation along with smaller phrase technology. Any transcriptional errors that result from this process are unintentional.    ?  ?  ? ?

## 2021-05-01 ENCOUNTER — Ambulatory Visit: Payer: 59 | Admitting: Family Medicine

## 2021-05-08 NOTE — Progress Notes (Signed)
?Renee [name] D.O. ?[facility] ?[place] ?Phone: [phone] ?Subjective:   ?I, Renee [name], am serving as a scribe for Dr. Hulan [name]. ? ?This visit occurred during the SARS-CoV-2 public health emergency.  Safety protocols were in place, including screening questions prior to the visit, additional usage of staff PPE, and extensive cleaning of exam room while observing appropriate contact time as indicated for disinfecting solutions.  ? ?I'm seeing this patient by the request  of:  Renee Queen, MD ? ?CC: Neck and low back pain follow-up ? ?[id number]  ?Renee [name] is a 51 y.o. female coming in with complaint of back and neck pain. OMT on 04/02/2021. Patient states that hills are bothering her back when she is out for walk.  Patient states that it does not stop her from activity but based sometimes difficulty it cannot push through some activity.  Patient denies any nighttime awakening. ? ?Medications patient has been prescribed: None ? ?Taking: ? ? ?  ? ? ? ? ?Reviewed prior external information including notes and imaging from previsou exam, outside providers and external EMR if available.  ? ?As well as notes that were available from care everywhere and other healthcare systems. ? ?Past medical history, social, surgical and family history all reviewed in electronic medical record.  No pertanent information unless stated regarding to the chief complaint.  ? ?Past Medical History:  ?Diagnosis Date  ? Headache   ? Lumbar radiculitis   ? Numbness and tingling in left arm   ? due to overdoing in in the gym with exercising   ? Thyroid disease   ? Vision disturbance   ?  ?No Known Allergies ? ? ?Review of Systems: ? No headache, visual changes, nausea, vomiting, diarrhea, constipation, dizziness, abdominal pain, skin rash, fevers, chills, night sweats, weight loss, swollen lymph nodes, body aches, joint swelling, chest pain, shortness of breath, mood changes.  POSITIVE muscle aches ? ?Objective  ?Blood pressure 106/70, pulse 87, height '5\' 4"'$  (1.626 m), weight 128 lb (58.1 kg). ?  ?General: No apparent distress alert and oriented x3 mood and affect normal, dressed appropriately.  ?HEENT: Pupils equal, extraocular movements intact  ?Respiratory: Patient's speak in full sentences and does not appear short of breath  ?Cardiovascular: No lower extremity edema, non tender, no erythema  ?Back exam does have some mild loss of lordosis.  Tightness noted mostly in the thoracolumbar junction.  Patient does have very mild tightness actually over the left sacroiliac joint.  Tenderness over the pubic bone and the symphysis.  Tightness with FABER test bilaterally.  Neck exam significant tightness going all the way to the occipital region bilaterally.  No midline tenderness.  Negative Spurling's ? ?Osteopathic findings ? ?C2 flexed rotated and side bent right ?C3 flexed rotated and side bent left ?T5 extended rotated and side bent right inhaled rib ?T11 extended rotated and side bent left ?L1 flexed rotated and side bent right ?Sacrum  left on left ?Pelvic shear noted ? ? ? ?  ?Assessment and Plan: ? ?Neck pain ?Chronic still associated with posture and stress. Discussed HEP, discussed time for self patient is in the process of selling her house as well and then considering a move to [place] over the course of the next year here.  Patient does have a son who will be a senior in high school so will be staying in the area for another year.  Encourage patient to continue to stay active and find time  for herself.  Patient is responding well to the manipulation and follow-up again in 6 to 8 weeks.  ? ?Nonallopathic problems ? ?Decision today to treat with OMT was based on Physical Exam ? ?After verbal consent patient was treated with HVLA, ME, FPR techniques in cervical, rib, thoracic, lumbar, and sacral and pelvis areas ? ?Patient tolerated the procedure well with improvement in  symptoms ? ?Patient given exercises, stretches and lifestyle modifications ? ?See medications in patient instructions if given ? ?Patient will follow up in 4-8 weeks ? ?  ?The above documentation has been reviewed and is accurate and complete [name], DO ? ? ? ?  ? ? Note: This dictation was prepared with Dragon dictation along with smaller phrase technology. Any transcriptional errors that result from this process are unintentional.    ?  ?  ? ?

## 2021-05-09 ENCOUNTER — Ambulatory Visit: Payer: 59 | Admitting: Family Medicine

## 2021-05-09 VITALS — BP 106/70 | HR 87 | Ht 64.0 in | Wt 128.0 lb

## 2021-05-09 DIAGNOSIS — M542 Cervicalgia: Secondary | ICD-10-CM

## 2021-05-09 DIAGNOSIS — M9903 Segmental and somatic dysfunction of lumbar region: Secondary | ICD-10-CM | POA: Diagnosis not present

## 2021-05-09 DIAGNOSIS — M9905 Segmental and somatic dysfunction of pelvic region: Secondary | ICD-10-CM | POA: Diagnosis not present

## 2021-05-09 DIAGNOSIS — M9901 Segmental and somatic dysfunction of cervical region: Secondary | ICD-10-CM | POA: Diagnosis not present

## 2021-05-09 DIAGNOSIS — M9902 Segmental and somatic dysfunction of thoracic region: Secondary | ICD-10-CM | POA: Diagnosis not present

## 2021-05-09 DIAGNOSIS — M9904 Segmental and somatic dysfunction of sacral region: Secondary | ICD-10-CM | POA: Diagnosis not present

## 2021-05-09 NOTE — Assessment & Plan Note (Addendum)
Chronic still associated with posture and stress. Discussed HEP, discussed time for self patient is in the process of selling her house as well and then considering a move to [place] over the course of the next year here.  Patient does have a son who will be a senior in high school so will be staying in the area for another year.  Encourage patient to continue to stay active and find time for herself.  Patient is responding well to the manipulation and follow-up again in 6 to 8 weeks. ?

## 2021-05-09 NOTE — Patient Instructions (Signed)
Good to see you ?Keep taking deep breaths ?See me again in 7-8 weeks ?

## 2021-06-26 NOTE — Progress Notes (Signed)
Renee [name] [address] [place] Phone: [phone] Subjective:   Renee [name], am serving as a scribe for Dr. Hulan Saas. This visit occurred during the SARS-CoV-2 public health emergency.  Safety protocols were in place, including screening questions prior to the visit, additional usage of staff PPE, and extensive cleaning of exam room while observing appropriate contact time as indicated for disinfecting solutions.   I'm seeing this patient by the request  of:  Dian Queen, MD  CC: Neck pain follow-up  [id number]  Renee [name] is a 51 y.o. female coming in with complaint of back and neck pain. OMT 05/09/2021. Patient states same per usual. Left knee bothersome. No other complaints.  Medications patient has been prescribed: None  Taking:         Reviewed prior external information including notes and imaging from previsou exam, outside providers and external EMR if available.   As well as notes that were available from care everywhere and other healthcare systems.  Past medical history, social, surgical and family history all reviewed in electronic medical record.  No pertanent information unless stated regarding to the chief complaint.   Past Medical History:  Diagnosis Date   Headache    Lumbar radiculitis    Numbness and tingling in left arm    due to overdoing in in the gym with exercising    Thyroid disease    Vision disturbance     No Known Allergies   Review of Systems:  No headache, visual changes, nausea, vomiting, diarrhea, constipation, dizziness, abdominal pain, skin rash, fevers, chills, night sweats, weight loss, swollen lymph nodes, body aches, joint swelling, chest pain, shortness of breath, mood changes. POSITIVE muscle aches  Objective  Blood pressure 118/72, pulse 75, height '5\' 4"'$  (1.626 m), weight 124 lb (56.2 kg), SpO2 98 %.   General: No apparent distress alert and oriented x3 mood  and affect normal, dressed appropriately.  HEENT: Pupils equal, extraocular movements intact  Respiratory: Patient's speak in full sentences and does not appear short of breath  Cardiovascular: No lower extremity edema, non tender, no erythema  Mild loss of lordosis.  Tightness noted in the parascapular area.  Seems to be more right greater than left.  Patient does have some tenderness to palpation also in the lower back. [place] [facility] Phone: [phone] Subjective:    I'm seeing this patient by the request  of:  Dian Queen, MD  CC:   [id number]  Renee [name] is a 51 y.o. female coming in with complaint of back and neck pain Patient states   Medications patient has been prescribed:   Taking:         Reviewed prior external information including notes and imaging from previsou exam, outside providers and external EMR if available.   As well as notes that were available from care everywhere and other healthcare systems.  Past medical history, social, surgical and family history all reviewed in electronic medical record.  No pertanent information unless stated regarding to the chief complaint.   Past Medical History:  Diagnosis Date   Headache    Lumbar radiculitis    Numbness and tingling in left arm    due to overdoing in in the gym with exercising    Thyroid disease    Vision disturbance     No Known Allergies   Review of Systems:  No headache, visual changes, nausea,  vomiting, diarrhea, constipation, dizziness, abdominal pain, skin rash, fevers, chills, night sweats, weight loss, swollen lymph nodes, body aches, joint swelling, chest pain, shortness of breath, mood changes. POSITIVE muscle aches  Objective  Blood pressure 118/72, pulse 75, height '5\' 4"'$  (5.035 m), weight 124 lb (56.2 kg), SpO2 98 %.   General: No apparent distress alert and oriented x3 mood and affect normal, dressed  appropriately.  HEENT: Pupils equal, extraocular movements intact  Respiratory: Patient's speak in full sentences and does not appear short of breath  Cardiovascular: No lower extremity edema, non tender, no erythema  Neuro: Cranial nerves II through XII are intact, neurovascularly intact in all extremities with 2+ DTRs and 2+ pulses.  Gait normal with good balance and coordination.  MSK:  Non tender with full range of motion and good stability and symmetric strength and tone of shoulders, elbows, wrist, hip, knee and ankles bilaterally.  Neck exam does have some mild loss of lordosis.  Osteopathic findings  C2 flexed rotated and side bent right C5 flexed rotated and side bent left T3 extended rotated and side bent right inhaled rib T9 extended rotated and side bent left L2 flexed rotated and side bent right Sacrum right on right       Assessment and Plan:  Neck pain Chronic, with mild exacerbation.  Patient has had some tightness.  Has been moving from time to time as well.  Discussed which activities to do which ones to avoid.  Increase activity slowly.  Patient will continue to monitor and follow-up with me again in 6 to 8 weeks   Nonallopathic problems  Decision today to treat with OMT was based on Physical Exam  After verbal consent patient was treated with HVLA, ME, FPR techniques in cervical, rib, thoracic, lumbar, and sacral  areas  Patient tolerated the procedure well with improvement in symptoms  Patient given exercises, stretches and lifestyle modifications  See medications in patient instructions if given  Patient will follow up in 4-8 weeks      The above documentation has been reviewed and is accurate and complete [name], DO        Note: This dictation was prepared with Dragon dictation along with smaller phrase technology. Any transcriptional errors that result from this process are unintentional.          Osteopathic findings  C2  flexed rotated and side bent right C6 flexed rotated and side bent left T3 extended rotated and side bent right inhaled rib T9 extended rotated and side bent left L2 flexed rotated and side bent right Sacrum right on right       Assessment and Plan:    Nonallopathic problems  Decision today to treat with OMT was based on Physical Exam  After verbal consent patient was treated with HVLA, ME, FPR techniques in cervical, rib, thoracic, lumbar, and sacral  areas  Patient tolerated the procedure well with improvement in symptoms  Patient given exercises, stretches and lifestyle modifications  See medications in patient instructions if given  Patient will follow up in 4-8 weeks      The above documentation has been reviewed and is accurate and complete [name], DO       Note: This dictation was prepared with Dragon dictation along with smaller phrase technology. Any transcriptional errors that result from this process are unintentional.

## 2021-06-27 ENCOUNTER — Ambulatory Visit: Payer: 59 | Admitting: Family Medicine

## 2021-06-27 VITALS — BP 118/72 | HR 75 | Ht 64.0 in | Wt 124.0 lb

## 2021-06-27 DIAGNOSIS — M542 Cervicalgia: Secondary | ICD-10-CM

## 2021-06-27 DIAGNOSIS — M9901 Segmental and somatic dysfunction of cervical region: Secondary | ICD-10-CM

## 2021-06-27 DIAGNOSIS — M9908 Segmental and somatic dysfunction of rib cage: Secondary | ICD-10-CM

## 2021-06-27 DIAGNOSIS — M9902 Segmental and somatic dysfunction of thoracic region: Secondary | ICD-10-CM | POA: Diagnosis not present

## 2021-06-27 DIAGNOSIS — M9904 Segmental and somatic dysfunction of sacral region: Secondary | ICD-10-CM

## 2021-06-27 DIAGNOSIS — M9903 Segmental and somatic dysfunction of lumbar region: Secondary | ICD-10-CM

## 2021-06-27 NOTE — Patient Instructions (Signed)
Knee exercises Try to find time for yourself See me again in 6 weeks

## 2021-06-27 NOTE — Assessment & Plan Note (Signed)
Chronic, with mild exacerbation.  Patient has had some tightness.  Has been moving from time to time as well.  Discussed which activities to do which ones to avoid.  Increase activity slowly.  Patient will continue to monitor and follow-up with me again in 6 to 8 weeks

## 2021-08-07 NOTE — Progress Notes (Signed)
Renee [name] [address] [place] Phone: [phone] Subjective:   IVilma [name], am serving as a scribe for Dr. Hulan Saas.  I'm seeing this patient by the request  of:  Dian Queen, MD  CC: Back and neck pain follow-up  [id number]  [name] is a 51 y.o. female coming in with complaint of back and neck pain. OMT 06/27/2021. Patient states about a month ago 2 weekends in a row having sharp shooting pain on the left side of neck when turning head to the left. Took Aleve and massaged it and was able to relieve the pain. No other complaints.  Medications patient has been prescribed: None  Taking:         Reviewed prior external information including notes and imaging from previsou exam, outside providers and external EMR if available.   As well as notes that were available from care everywhere and other healthcare systems.  Past medical history, social, surgical and family history all reviewed in electronic medical record.  No pertanent information unless stated regarding to the chief complaint.   Past Medical History:  Diagnosis Date   Headache    Lumbar radiculitis    Numbness and tingling in left arm    due to overdoing in in the gym with exercising    Thyroid disease    Vision disturbance     No Known Allergies   Review of Systems:  No headache, visual changes, nausea, vomiting, diarrhea, constipation, dizziness, abdominal pain, skin rash, fevers, chills, night sweats, weight loss, swollen lymph nodes, body aches, joint swelling, chest pain, shortness of breath, mood changes. POSITIVE muscle aches  Objective  Blood pressure 110/72, pulse 94, height '5\' 4"'$  (1.626 m), weight 127 lb (57.6 kg), SpO2 98 %.   General: No apparent distress alert and oriented x3 mood and affect normal, dressed appropriately.  HEENT: Pupils equal, extraocular movements intact  Respiratory: Patient's speak in full sentences and  does not appear short of breath  Cardiovascular: No lower extremity edema, non tender, no erythema  Gait normal gait MSK:  Back seems to be doing relatively well with some mild tightness with FABER test bilaterally.  Patient now has significant increase in tightness of the left side of the neck with left-sided rotation and right-sided sidebending.  Negative Spurling's.  Tightness in the occipital region noted as well.  Osteopathic findings  C2 flexed rotated and side bent left C6 flexed rotated and side bent left T3 extended rotated and side bent left inhaled rib T9 extended rotated and side bent left L2 flexed rotated and side bent right Sacrum right on right       Assessment and Plan:  Neck pain Chronic, with mild worsening noted.  We discussed with patient about the ibuprofen because patient is having more of an exacerbation.  Seems to be more some ergonomics.  The patient is also moved into a new place and has a different set up for her TV and we encouraged to try to change her ergonomics.  Patient will increase activity otherwise slowly.  Follow-up again in 6 to 8 weeks.    Nonallopathic problems  Decision today to treat with OMT was based on Physical Exam  After verbal consent patient was treated with HVLA, ME, FPR techniques in cervical, rib, thoracic, lumbar, and sacral  areas  Patient tolerated the procedure well with improvement in symptoms  Patient given exercises, stretches and lifestyle modifications  See medications in patient instructions  if given  Patient will follow up in 4-8 weeks     The above documentation has been reviewed and is accurate and complete [name], DO         Note: This dictation was prepared with Dragon dictation along with smaller phrase technology. Any transcriptional errors that result from this process are unintentional.

## 2021-08-08 ENCOUNTER — Ambulatory Visit: Payer: 59 | Admitting: Family Medicine

## 2021-08-08 VITALS — BP 110/72 | HR 94 | Ht 64.0 in | Wt 127.0 lb

## 2021-08-08 DIAGNOSIS — M9901 Segmental and somatic dysfunction of cervical region: Secondary | ICD-10-CM | POA: Diagnosis not present

## 2021-08-08 DIAGNOSIS — M542 Cervicalgia: Secondary | ICD-10-CM | POA: Diagnosis not present

## 2021-08-08 DIAGNOSIS — M9908 Segmental and somatic dysfunction of rib cage: Secondary | ICD-10-CM

## 2021-08-08 DIAGNOSIS — M9903 Segmental and somatic dysfunction of lumbar region: Secondary | ICD-10-CM | POA: Diagnosis not present

## 2021-08-08 DIAGNOSIS — M9904 Segmental and somatic dysfunction of sacral region: Secondary | ICD-10-CM

## 2021-08-08 DIAGNOSIS — M9902 Segmental and somatic dysfunction of thoracic region: Secondary | ICD-10-CM | POA: Diagnosis not present

## 2021-08-08 NOTE — Patient Instructions (Signed)
Good to see you! GO pick up the dog Watch using the phone a lot See you again in 5-6 weeks

## 2021-08-08 NOTE — Assessment & Plan Note (Signed)
Chronic, with mild worsening noted.  We discussed with patient about the ibuprofen because patient is having more of an exacerbation.  Seems to be more some ergonomics.  The patient is also moved into a new place and has a different set up for her TV and we encouraged to try to change her ergonomics.  Patient will increase activity otherwise slowly.  Follow-up again in 6 to 8 weeks.

## 2021-09-11 NOTE — Progress Notes (Unsigned)
  [place] Phone: [phone] Subjective:   Renee [name], am serving as a scribe for Dr. Hulan Saas.   I'm seeing this patient by the request  of:  Dian Queen, MD  CC: Neck and low back pain  [id number]  Renee [name] is a 51 y.o. female coming in with complaint of back and neck pain. OMT 08/08/2021. Patient states that she is doing well.   Medications patient has been prescribed: None  Taking:       Past Medical History:  Diagnosis Date   Headache    Lumbar radiculitis    Numbness and tingling in left arm    due to overdoing in in the gym with exercising    Thyroid disease    Vision disturbance     [name] Known Allergies    Objective  Blood pressure 100/62, pulse 75, height '5\' 4"'$  (1.626 m), weight 129 lb (58.5 kg), SpO2 99 %.   General: [name] apparent distress alert and oriented x3 mood and affect normal, dressed appropriately.  HEENT: Pupils equal, extraocular movements intact  Respiratory: Patient's speak in full sentences and does not appear short of breath  Cardiovascular: [name] lower extremity edema, non tender, [name] erythema  Gait MSK:  Back does have some loss of lordosis.  Patient does have some tightness around the sacroiliac joint as well.  Patient also has some limited sidebending as well of the neck.  Negative Spurling's.  Osteopathic findings  C2 flexed rotated and side bent right C6 flexed rotated and side bent left T3 extended rotated and side bent right inhaled rib T9 extended rotated and side bent left L2 flexed rotated and side bent right Sacrum right on right    Assessment and Plan:  Neck pain Chronic but stable.  Discussed HEP  Discussed which activities to do  Ibuprofen for breakthrough  RTC in 6 weeks     Nonallopathic problems  Decision today to treat with OMT was based on Physical Exam  After verbal consent patient was treated with HVLA, ME, FPR  techniques in cervical, rib, thoracic, lumbar, and sacral  areas  Patient tolerated the procedure well with improvement in symptoms  Patient given exercises, stretches and lifestyle modifications  See medications in patient instructions if given  Patient will follow up in 4-8 weeks     The above documentation has been reviewed and is accurate and complete [name], DO         Note: This dictation was prepared with Dragon dictation along with smaller phrase technology. Any transcriptional errors that result from this process are unintentional.

## 2021-09-12 ENCOUNTER — Ambulatory Visit: Payer: 59 | Admitting: Family Medicine

## 2021-09-12 VITALS — BP 100/62 | HR 75 | Ht 64.0 in | Wt 129.0 lb

## 2021-09-12 DIAGNOSIS — M9903 Segmental and somatic dysfunction of lumbar region: Secondary | ICD-10-CM

## 2021-09-12 DIAGNOSIS — M542 Cervicalgia: Secondary | ICD-10-CM

## 2021-09-12 DIAGNOSIS — M9904 Segmental and somatic dysfunction of sacral region: Secondary | ICD-10-CM | POA: Diagnosis not present

## 2021-09-12 DIAGNOSIS — M9902 Segmental and somatic dysfunction of thoracic region: Secondary | ICD-10-CM | POA: Diagnosis not present

## 2021-09-12 DIAGNOSIS — M9908 Segmental and somatic dysfunction of rib cage: Secondary | ICD-10-CM

## 2021-09-12 DIAGNOSIS — M9901 Segmental and somatic dysfunction of cervical region: Secondary | ICD-10-CM | POA: Diagnosis not present

## 2021-09-12 NOTE — Patient Instructions (Signed)
Good to see you Ginger chews or apple cider vinegar in the morning See  you again in 7-8 weeks

## 2021-09-12 NOTE — Assessment & Plan Note (Signed)
Chronic but stable.  Discussed HEP  Discussed which activities to do  Ibuprofen for breakthrough  RTC in 6 weeks

## 2021-10-08 ENCOUNTER — Telehealth: Payer: Self-pay | Admitting: Internal Medicine

## 2021-10-08 NOTE — Telephone Encounter (Signed)
Patient called requesting to speak with a nurse if possible states her pain is coming back and Dr. Carlean Purl advised her to call when that happened. Seeking further advise.

## 2021-10-09 NOTE — Telephone Encounter (Signed)
Pt states that she has been having discomfort on her upper right side under rib cage off and on for 8 years: Pt stated that the pain has increase recently over the last month: Before this recent episode pt stated that she feels as if it has been over a year since she experienced an episode: Pt states that it feels like a gassy bubble: Pt stated that she takes digestive enzymes along with gas x which helps some: Pt requesting for an appt to see Dr. Carlean Purl: Pt previously scheduled to see Dr. Carlean Purl on 12/16/2021: Pt aware  Pt verbalized understanding with all questions answered.

## 2021-10-25 NOTE — Telephone Encounter (Signed)
Left message for pt to call back  °

## 2021-10-25 NOTE — Telephone Encounter (Signed)
Pt stated that she went to her local Dr.s Office and seen an [facility] in [place] and they started her on a PPI and questioning doing an Ultrasound: Pt stated that she just wants to notify our off and make sure this in her Chart. Pt stated that she still wants to keep her follow up with Dr. Carlean Purl

## 2021-10-25 NOTE — Telephone Encounter (Signed)
Calls daily to check on cancellations. Wonders if we have any advice on what to do for Right upper side abdominal pain until her upcoming appointment.

## 2021-10-25 NOTE — Telephone Encounter (Signed)
Patient is returning your call.  

## 2021-10-30 ENCOUNTER — Ambulatory Visit: Payer: 59 | Admitting: Family Medicine

## 2021-11-04 ENCOUNTER — Other Ambulatory Visit: Payer: Self-pay | Admitting: Family Medicine

## 2021-11-04 DIAGNOSIS — R1013 Epigastric pain: Secondary | ICD-10-CM

## 2021-11-06 ENCOUNTER — Ambulatory Visit: Payer: 59 | Admitting: Family Medicine

## 2021-11-06 NOTE — Progress Notes (Unsigned)
[name] Phone: [phone] Subjective:   [name], am serving as a scribe for Dr. Hulan Saas.  I'm seeing this patient by the request  of:  Dian Queen, MD  CC: back and neck pain follow up   [id number]  Renee [name] is a 51 y.o. female coming in with complaint of back and neck pain. OMT on 09/12/2021. Patient states that her back is tight.  Nothing new.  Still trying to find times to do all the workouts that she is having difficulty with.  Is doing a lot of fast walking though.  Medications patient has been prescribed:   Taking:         Reviewed prior external information including notes and imaging from previsou exam, outside providers and external EMR if available.   As well as notes that were available from care everywhere and other healthcare systems.  Past medical history, social, surgical and family history all reviewed in electronic medical record.  No pertanent information unless stated regarding to the chief complaint.   Past Medical History:  Diagnosis Date   Headache    Lumbar radiculitis    Numbness and tingling in left arm    due to overdoing in in the gym with exercising    Thyroid disease    Vision disturbance     No Known Allergies   Review of Systems:  No headache, visual changes, nausea, vomiting, diarrhea, constipation, dizziness, abdominal pain, skin rash, fevers, chills, night sweats, weight loss, swollen lymph nodes, body aches, joint swelling, chest pain, shortness of breath, mood changes. POSITIVE muscle aches  Objective  Blood pressure 98/70, pulse 76, height '5\' 4"'$  (1.626 m), weight 129 lb (58.5 kg), SpO2 98 %.   General: No apparent distress alert and oriented x3 mood and affect normal, dressed appropriately.  HEENT: Pupils equal, extraocular movements intact  Respiratory: Patient's speak in full sentences and does not appear short of breath   Cardiovascular: No lower extremity edema, non tender, no erythema  MSK:  Back still has tightness noted.  Patient does have some more tightness on the left side with his straight leg test.  Still having difficulty with pain over the sacroiliac joints bilaterally.  Neck exam also has some limited sidebending bilaterally.  Osteopathic findings  C2 flexed rotated and side bent right C6 flexed rotated and side bent left T3 extended rotated and side bent right inhaled rib T9 extended rotated and side bent right L2 flexed rotated and side bent right L3 flexed rotated and side bent left Sacrum right on right       Assessment and Plan:  Left lumbar radiculitis Patient continues to have tightness.  Still has neck pain that is out of the ordinary as well.  Seems to be more secondary to some of postural asterixis and I do feel that some anxiety is playing a role as well.  Patient still having difficulty working out on a regular basis.  Discussed some hip abductor strengthening exercises as well.  Follow-up again in 6 to 8 weeks.    Nonallopathic problems  Decision today to treat with OMT was based on Physical Exam  After verbal consent patient was treated with HVLA, ME, FPR techniques in cervical, rib, thoracic, lumbar, and sacral  areas  Patient tolerated the procedure well with improvement in symptoms  Patient given exercises, stretches and lifestyle modifications  See medications in patient instructions if given  Patient will follow  up in 4-8 weeks    The above documentation has been reviewed and is accurate and complete [name], DO          Note: This dictation was prepared with Dragon dictation along with smaller phrase technology. Any transcriptional errors that result from this process are unintentional.

## 2021-11-07 ENCOUNTER — Encounter: Payer: Self-pay | Admitting: Family Medicine

## 2021-11-07 ENCOUNTER — Ambulatory Visit: Payer: 59 | Admitting: Family Medicine

## 2021-11-07 VITALS — BP 98/70 | HR 76 | Ht 64.0 in | Wt 129.0 lb

## 2021-11-07 DIAGNOSIS — M9903 Segmental and somatic dysfunction of lumbar region: Secondary | ICD-10-CM | POA: Diagnosis not present

## 2021-11-07 DIAGNOSIS — M9908 Segmental and somatic dysfunction of rib cage: Secondary | ICD-10-CM | POA: Diagnosis not present

## 2021-11-07 DIAGNOSIS — M9901 Segmental and somatic dysfunction of cervical region: Secondary | ICD-10-CM | POA: Diagnosis not present

## 2021-11-07 DIAGNOSIS — M5416 Radiculopathy, lumbar region: Secondary | ICD-10-CM | POA: Diagnosis not present

## 2021-11-07 DIAGNOSIS — M9902 Segmental and somatic dysfunction of thoracic region: Secondary | ICD-10-CM

## 2021-11-07 DIAGNOSIS — M9904 Segmental and somatic dysfunction of sacral region: Secondary | ICD-10-CM

## 2021-11-07 NOTE — Patient Instructions (Addendum)
Good to see you Go buy some cool shoes Keep working on posture See me again in 5-7 weeks

## 2021-11-07 NOTE — Assessment & Plan Note (Signed)
Patient continues to have tightness.  Still has neck pain that is out of the ordinary as well.  Seems to be more secondary to some of postural asterixis and I do feel that some anxiety is playing a role as well.  Patient still having difficulty working out on a regular basis.  Discussed some hip abductor strengthening exercises as well.  Follow-up again in 6 to 8 weeks.

## 2021-11-27 ENCOUNTER — Encounter: Payer: Self-pay | Admitting: Internal Medicine

## 2021-11-27 ENCOUNTER — Telehealth: Payer: 59 | Admitting: Internal Medicine

## 2021-11-27 VITALS — BP 106/72 | HR 84 | Ht 64.0 in | Wt 131.0 lb

## 2021-11-27 DIAGNOSIS — R1013 Epigastric pain: Secondary | ICD-10-CM

## 2021-11-27 NOTE — Progress Notes (Signed)
Renee [name] 51 y.o. March 12, 1970 [id number]  Assessment & Plan:   Encounter Diagnoses  Name Primary?   Abdominal pain, epigastric Yes   Dyspepsia     The response to PPI suggests an acid peptic type problem like gastritis, perhaps ulcer disease.  H. pylori is in the differential.  We discussed the options of testing for H. pylori with stool antigen versus going to endoscopy.  She prefers checking for H. pylori first.  We will do that.  If positive will treat and check for eradication.  If that is negative and symptoms persist I think upper GI endoscopy would be the next step.  Ultrasound should be considered also though I would not start there now given the response to acid suppression.  Once her H. pylori test is complete she could use as needed Pepcid versus OTC omeprazole or Nexium.    Subjective:   Chief Complaint: Epigastric pain  HPI 52 year old white woman presents with a history of epigastric pain radiating to the right upper quadrant and around the right flank since July.  She was eating a [place] not at the time and developed a spastic leg pain in the epigastrium and used a heating pad.  Then subsequently was having burning and gas bubble symptoms in the epigastrium and right upper quadrant.  She felt like it was more of a problem when sitting but less with moving around and walking.  Sometimes fatty foods will make it worse.  While waiting for this appointment she went to [facility] walk-in clinic and was prescribed pantoprazole for 1 month and she just finished that and within days the symptoms abated.  They might be starting to come back having been off a few days.  She does use ibuprofen from time to time but not day in and day out.  She will use that for headaches she does not think there was heavy use associated with the onset of the symptoms.  Laboratory testing then with a normal lipase at 29 and a normal CMET, slightly elevated eosinophils at 10% on a CBC.  She had also had  annual gynecologic physical labs in September that showed similar findings, A1c 5.4%.  Transaminases in the teens.  Vitamin B12 level was good.  So was vitamin D level.  She has not had any unintentional weight loss or appetite changes.  GI review of systems is otherwise negative.  Wt Readings from Last 3 Encounters:  11/27/21 131 lb (59.4 kg)  11/07/21 129 lb (58.5 kg)  09/12/21 129 lb (58.5 kg)    No Known Allergies Current Meds  Medication Sig   Cholecalciferol (VITAMIN D) 2000 units CAPS Take by mouth.   Cholecalciferol (VITAMIN D3) 1.25 MG (50000 UT) CAPS Take 5,000 Int'l Units by mouth daily.   Cyanocobalamin (VITAMIN B 12 PO) Take by mouth.   ibuprofen (ADVIL,MOTRIN) 600 MG tablet Take 1 tablet (600 mg total) by mouth every 6 (six) hours as needed (mild pain).   Magnesium 250 MG TABS Take 1,350 mg by mouth daily.   Nutritional Supplements (DHEA PO) Take 15 mg by mouth daily.   Omega-3 Fatty Acids (FISH OIL) 1000 MG CAPS Take by mouth.   Phytonadione (MEPHYTON PO) Take 150 mcg by mouth daily.   Pregnenolone Micronized (PREGNENOLONE PO) Take 75 mg by mouth 4 (four) times a week.   progesterone (PROMETRIUM) 100 MG capsule Take 100 mg by mouth at bedtime.   Pyridoxine HCl (B-6 PO) Take by mouth.   thyroid (ARMOUR) 30 MG  tablet Take 30 mg by mouth daily.   Past Medical History:  Diagnosis Date   Headache    Lumbar radiculitis    Numbness and tingling in left arm    due to overdoing in in the gym with exercising    Thyroid disease    Vision disturbance    Past Surgical History:  Procedure Laterality Date   VAGINAL HYSTERECTOMY N/A 12/02/2016   Procedure: HYSTERECTOMY VAGINAL WITH BILATERAL SALPINGECTOMY;  Surgeon: Dian Queen, MD;  Location: [facility];  Service: Gynecology;  Laterality: N/A;   WISDOM TOOTH EXTRACTION     Social History   Social History Narrative   Lives at home with husband and two sons.   Right-handed.   1 cup caffeine daily.    family history includes Atrial fibrillation in her mother; Colon cancer in her paternal grandmother; Diabetes in her mother; Diverticulitis in her father; Hypertension in her mother.   Review of Systems As per HPI  Objective:   Physical Exam '@BP'$  106/72   Pulse 84   Ht '5\' 4"'$  (1.626 m)   Wt 131 lb (59.4 kg)   BMI 22.49 kg/m @  General:  NAD Eyes:   anicteric Lungs:  clear Heart::  S1S2 no rubs, murmurs or gallops Abdomen:  soft and nontender, BS+ no hepatosplenomegaly or mass Ext:   no edema, cyanosis or clubbing    Data Reviewed:  See HPI

## 2021-11-27 NOTE — Patient Instructions (Signed)

## 2021-12-16 ENCOUNTER — Ambulatory Visit: Payer: 59 | Admitting: Internal Medicine

## 2021-12-17 NOTE — Progress Notes (Signed)
[place] Phone: [phone] Subjective:   Renee [name], am serving as a scribe for Dr. Hulan Saas.  I'm seeing this patient by the request  of:  Renee Queen, MD  CC:   [id number]  Renee [name] is a 51 y.o. female coming in with complaint of back and neck pain. OMT 11/07/2021. Patient states that her neck pain has increased since last visit. Clenching jaw post acupuncture and her muscles   Medications patient has been prescribed: None  Taking:         Reviewed prior external information including notes and imaging from previsou exam, outside providers and external EMR if available.   As well as notes that were available from care everywhere and other healthcare systems.  Past medical history, social, surgical and family history all reviewed in electronic medical record.  [name] pertanent information unless stated regarding to the chief complaint.   Past Medical History:  Diagnosis Date   Headache    Lumbar radiculitis    Numbness and tingling in left arm    due to overdoing in in the gym with exercising    Thyroid disease    Vision disturbance     [name] Known Allergies   Review of Systems:  [name] headache, visual changes, nausea, vomiting, diarrhea, constipation, dizziness, abdominal pain, skin rash, fevers, chills, night sweats, weight loss, swollen lymph nodes, body aches, joint swelling, chest pain, shortness of breath, mood changes. POSITIVE muscle aches  Objective  Blood pressure 102/72, pulse 87, height '5\' 4"'$  (1.626 m), weight 131 lb (59.4 kg), last menstrual period 11/20/2016, SpO2 98 %.   General: [name] apparent distress alert and oriented x3 mood and affect normal, dressed appropriately.  HEENT: Pupils equal, extraocular movements intact  Respiratory: Patient's speak in full sentences and does not appear short of breath  Cardiovascular: [name] lower extremity edema, non tender, [name]  erythema  Gait MSK:  Back low back exam does have some loss of lordosis.  Some tenderness to palpation.  Still has significant tightness though noted in the neck itself.  Osteopathic findings  C6 flexed rotated and side bent left T3 extended rotated and side bent right inhaled rib T6 extended rotated and side bent left L2 flexed rotated and side bent right Sacrum right on right Pelvic shear noted      Assessment and Plan:  Neck pain Continued neck pain.  Patient still has significant amount of psychosomatic aspect of this as well.  Patient is working with multiple different people and different modalities which has been helpful.  I have told patient to be proud of the improvement she has made but I do think we still have some long ways to go.  Patient will follow-up with me again in 6 to 8 weeks for further evaluation.  See if there is any other muscle imbalances.  Has responded well to OMT previously.    Nonallopathic problems  Decision today to treat with OMT was based on Physical Exam  After verbal consent patient was treated with HVLA, ME, FPR techniques in cervical, rib, thoracic, lumbar, and sacral, pelvis areas  Patient tolerated the procedure well with improvement in symptoms  Patient given exercises, stretches and lifestyle modifications  See medications in patient instructions if given  Patient will follow up in 4-8 weeks    The above documentation has been reviewed and is accurate and complete Renee Pulley, DO  Note: This dictation was prepared with Dragon dictation along with smaller phrase technology. Any transcriptional errors that result from this process are unintentional.

## 2021-12-19 ENCOUNTER — Ambulatory Visit: Payer: 59 | Admitting: Family Medicine

## 2021-12-19 ENCOUNTER — Ambulatory Visit (INDEPENDENT_AMBULATORY_CARE_PROVIDER_SITE_OTHER): Payer: 59

## 2021-12-19 VITALS — BP 102/72 | HR 87 | Ht 64.0 in | Wt 131.0 lb

## 2021-12-19 DIAGNOSIS — M9908 Segmental and somatic dysfunction of rib cage: Secondary | ICD-10-CM | POA: Diagnosis not present

## 2021-12-19 DIAGNOSIS — M542 Cervicalgia: Secondary | ICD-10-CM

## 2021-12-19 DIAGNOSIS — M9901 Segmental and somatic dysfunction of cervical region: Secondary | ICD-10-CM | POA: Diagnosis not present

## 2021-12-19 DIAGNOSIS — M9902 Segmental and somatic dysfunction of thoracic region: Secondary | ICD-10-CM

## 2021-12-19 DIAGNOSIS — M9905 Segmental and somatic dysfunction of pelvic region: Secondary | ICD-10-CM

## 2021-12-19 DIAGNOSIS — M9904 Segmental and somatic dysfunction of sacral region: Secondary | ICD-10-CM

## 2021-12-19 DIAGNOSIS — M9903 Segmental and somatic dysfunction of lumbar region: Secondary | ICD-10-CM | POA: Diagnosis not present

## 2021-12-19 NOTE — Patient Instructions (Signed)
Xray today Be proud of progress Get a message See me again in 6-8 weeks

## 2021-12-20 ENCOUNTER — Encounter: Payer: Self-pay | Admitting: Family Medicine

## 2021-12-20 NOTE — Assessment & Plan Note (Signed)
Continued neck pain.  Patient still has significant amount of psychosomatic aspect of this as well.  Patient is working with multiple different people and different modalities which has been helpful.  I have told patient to be proud of the improvement she has made but I do think we still have some long ways to go.  Patient will follow-up with me again in 6 to 8 weeks for further evaluation.  See if there is any other muscle imbalances.  Has responded well to OMT previously.

## 2021-12-26 ENCOUNTER — Other Ambulatory Visit: Payer: 59

## 2021-12-26 DIAGNOSIS — R1013 Epigastric pain: Secondary | ICD-10-CM

## 2021-12-28 LAB — H. PYLORI ANTIGEN, STOOL: H pylori Ag, Stl: NEGATIVE

## 2022-01-28 NOTE — Progress Notes (Signed)
[place] Phone: [phone] Subjective:   Renee [name], am serving as a scribe for Dr. Hulan Saas.  I'm seeing this patient by the request  of:  Renee Queen, MD  CC: Neck and back pain follow-up  [id number]  Renee [name] is a 51 y.o. female coming in with complaint of back and neck pain. OMT 12/19/2021. Patient states that she has not had a good week. Has not slept very good and is experiencing a lot of neck tightness.   Medications patient has been prescribed: None  Taking:         Reviewed prior external information including notes and imaging from previsou exam, outside providers and external EMR if available.   As well as notes that were available from care everywhere and other healthcare systems.  Past medical history, social, surgical and family history all reviewed in electronic medical record.  [name] pertanent information unless stated regarding to the chief complaint.   Past Medical History:  Diagnosis Date   Headache    Lumbar radiculitis    Numbness and tingling in left arm    due to overdoing in in the gym with exercising    Thyroid disease    Vision disturbance     [name] Known Allergies   Review of Systems:  [name] headache, visual changes, nausea, vomiting, diarrhea, constipation, dizziness, abdominal pain, skin rash, fevers, chills, night sweats, weight loss, swollen lymph nodes, body aches, joint swelling, chest pain, shortness of breath, mood changes. POSITIVE muscle aches  Objective  Pulse 81, height '5\' 4"'$  (1.626 m), weight 133 lb (60.3 kg), last menstrual period 11/20/2016, SpO2 98 %.   General: [name] apparent distress alert and oriented x3 mood and affect normal, dressed appropriately.  HEENT: Pupils equal, extraocular movements intact  Respiratory: Patient's speak in full sentences and does not appear short of breath  Cardiovascular: [name] lower extremity edema, non tender,  [name] erythema  Neck and back pain does have some limited range of motion.  Significant more tightness in the neck actually bilaterally than patient's baseline usually.  Patient does still have weakness noted of the hip abductors 2.  Osteopathic findings  C2 flexed rotated and side bent right C4 flexed rotated and side bent left C6 flexed rotated and side bent left T3 extended rotated and side bent right inhaled rib T6 extended rotated and side bent right T9 extended rotated and side bent left L2 flexed rotated and side bent right L5 flexed rotated and side bent left Sacrum right on right       Assessment and Plan:  Neck pain Continuing to have a significant amount of stress.  Patient still wants to avoid any type of medications.  Discussed other over-the-counter things that could be more beneficial.  Having more difficulty with sleep recently.  We did discuss that if she needs to talk to anyone else we can see if we can get other people involved.  Patient is seeing a therapist and doing a lot of meditation.  Encouraged her to continue to work on the home exercises.  Follow-up with me again in 4 to 6 weeks.    Nonallopathic problems  Decision today to treat with OMT was based on Physical Exam  After verbal consent patient was treated with HVLA, ME, FPR techniques in cervical, rib, thoracic, lumbar, and sacral  areas  Patient tolerated the procedure well with improvement in symptoms  Patient given exercises, stretches and lifestyle  modifications  See medications in patient instructions if given  Patient will follow up in 4-8 weeks     The above documentation has been reviewed and is accurate and complete Renee Pulley, DO         Note: This dictation was prepared with Dragon dictation along with smaller phrase technology. Any transcriptional errors that result from this process are unintentional.

## 2022-01-30 ENCOUNTER — Ambulatory Visit: Payer: 59 | Admitting: Family Medicine

## 2022-01-30 VITALS — HR 81 | Ht 64.0 in | Wt 133.0 lb

## 2022-01-30 DIAGNOSIS — M9908 Segmental and somatic dysfunction of rib cage: Secondary | ICD-10-CM | POA: Diagnosis not present

## 2022-01-30 DIAGNOSIS — M9904 Segmental and somatic dysfunction of sacral region: Secondary | ICD-10-CM

## 2022-01-30 DIAGNOSIS — M9902 Segmental and somatic dysfunction of thoracic region: Secondary | ICD-10-CM | POA: Diagnosis not present

## 2022-01-30 DIAGNOSIS — M9903 Segmental and somatic dysfunction of lumbar region: Secondary | ICD-10-CM | POA: Diagnosis not present

## 2022-01-30 DIAGNOSIS — M542 Cervicalgia: Secondary | ICD-10-CM

## 2022-01-30 DIAGNOSIS — M9901 Segmental and somatic dysfunction of cervical region: Secondary | ICD-10-CM | POA: Diagnosis not present

## 2022-01-30 NOTE — Patient Instructions (Signed)
Good to see you today!  Lavender essential oils  Tart cherry extract [phone]  Tennis ball in a tube sock  See me again in 5-6 weeks

## 2022-02-01 ENCOUNTER — Encounter: Payer: Self-pay | Admitting: Family Medicine

## 2022-02-01 NOTE — Assessment & Plan Note (Signed)
Continuing to have a significant amount of stress.  Patient still wants to avoid any type of medications.  Discussed other over-the-counter things that could be more beneficial.  Having more difficulty with sleep recently.  We did discuss that if she needs to talk to anyone else we can see if we can get other people involved.  Patient is seeing a therapist and doing a lot of meditation.  Encouraged her to continue to work on the home exercises.  Follow-up with me again in 4 to 6 weeks.

## 2022-03-11 NOTE — Progress Notes (Unsigned)
[place] Phone: [phone] Subjective:   Renee [name], am serving as a scribe for Dr. Hulan Saas.  I'm seeing this patient by the request  of:  Dian Queen, MD  CC: back and neck pain   [id number]  Renee [name] is a 52 y.o. female coming in with complaint of back and neck pain. OMT on 01/30/2022. Patient states that she is doing well since last visit.   Medications patient has been prescribed: None         Reviewed prior external information including notes and imaging from previsou exam, outside providers and external EMR if available.   As well as notes that were available from care everywhere and other healthcare systems.  Past medical history, social, surgical and family history all reviewed in electronic medical record.  [name] pertanent information unless stated regarding to the chief complaint.   Past Medical History:  Diagnosis Date   Headache    Lumbar radiculitis    Numbness and tingling in left arm    due to overdoing in in the gym with exercising    Thyroid disease    Vision disturbance      Review of Systems:  [name] headache, visual changes, nausea, vomiting, diarrhea, constipation, dizziness, abdominal pain, skin rash, fevers, chills, night sweats, weight loss, swollen lymph nodes, body aches, joint swelling, chest pain, shortness of breath, mood changes. POSITIVE muscle aches  Objective  Blood pressure 110/78, pulse 73, height '5\' 4"'$  (1.626 m), weight 137 lb (62.1 kg), last menstrual period 11/20/2016, SpO2 98 %.   General: [name] apparent distress alert and oriented x3 mood and affect normal, dressed appropriately.  HEENT: Pupils equal, extraocular movements intact  Respiratory: Patient's speak in full sentences and does not appear short of breath  Cardiovascular: [name] lower extremity edema, non tender, [name] erythema  Neck exam does have some mild loss of lordosis.  Continues to  have tightness bilaterally patient does have tightness more in the right shoulder blade also noted.  Mild pelvic shear noted.  Some tenderness in the back noted.  Fall seems to be mostly muscular skeletal.  Osteopathic findings  C3 flexed rotated and side bent right C6 flexed rotated and side bent left T3 extended rotated and side bent right inhaled rib T6 extended rotated and side bent left L2 flexed rotated and side bent right Sacrum right on right Pelvis shear noted on right       Assessment and Plan:  Neck pain Neck pain is still multifactorial.  Patient is starting to pack getting ready further removed.  Patient will not have all her regular house done starting in March.  Discussed which activities to do and which ones to avoid.  Increase activity slowly.  Increase activity slowly.  Will follow-up again in 6 to 8 weeks.    Nonallopathic problems  Decision today to treat with OMT was based on Physical Exam  After verbal consent patient was treated with HVLA, ME, FPR techniques in cervical, rib, thoracic, lumbar, and sacral  pelvis  areas  Patient tolerated the procedure well with improvement in symptoms  Patient given exercises, stretches and lifestyle modifications  See medications in patient instructions if given  Patient will follow up in 4-8 weeks     The above documentation has been reviewed and is accurate and complete [name], DO         Note: This dictation was prepared with Dragon dictation along  with smaller phrase technology. Any transcriptional errors that result from this process are unintentional.

## 2022-03-13 ENCOUNTER — Ambulatory Visit: Payer: 59 | Admitting: Family Medicine

## 2022-03-13 VITALS — BP 110/78 | HR 73 | Ht 64.0 in | Wt 137.0 lb

## 2022-03-13 DIAGNOSIS — M9903 Segmental and somatic dysfunction of lumbar region: Secondary | ICD-10-CM | POA: Diagnosis not present

## 2022-03-13 DIAGNOSIS — M9904 Segmental and somatic dysfunction of sacral region: Secondary | ICD-10-CM

## 2022-03-13 DIAGNOSIS — M542 Cervicalgia: Secondary | ICD-10-CM

## 2022-03-13 DIAGNOSIS — M9905 Segmental and somatic dysfunction of pelvic region: Secondary | ICD-10-CM

## 2022-03-13 DIAGNOSIS — M9908 Segmental and somatic dysfunction of rib cage: Secondary | ICD-10-CM

## 2022-03-13 DIAGNOSIS — M9902 Segmental and somatic dysfunction of thoracic region: Secondary | ICD-10-CM | POA: Diagnosis not present

## 2022-03-13 DIAGNOSIS — M9901 Segmental and somatic dysfunction of cervical region: Secondary | ICD-10-CM

## 2022-03-13 NOTE — Assessment & Plan Note (Signed)
Neck pain is still multifactorial.  Patient is starting to pack getting ready further removed.  Patient will not have all her regular house done starting in March.  Discussed which activities to do and which ones to avoid.  Increase activity slowly.  Increase activity slowly.  Will follow-up again in 6 to 8 weeks.

## 2022-03-13 NOTE — Patient Instructions (Signed)
Good to see you Keep up with modalities See me in 2 months

## 2022-04-16 ENCOUNTER — Other Ambulatory Visit: Payer: Self-pay

## 2022-04-16 ENCOUNTER — Telehealth: Payer: Self-pay | Admitting: Internal Medicine

## 2022-04-16 DIAGNOSIS — G8929 Other chronic pain: Secondary | ICD-10-CM

## 2022-04-16 NOTE — Telephone Encounter (Signed)
Inbound call from patient stating she having abdominal pain and wanted to see if she could have a ultrasound. Patient was scheduled for 5/3 with Dr. Carlean Purl and wanted a sooner appointment, so she was given 4/11 at 11:00. Patient is requesting a call back to discuss. Please advise.

## 2022-04-16 NOTE — Telephone Encounter (Unsigned)
Left message for pt to call back  °

## 2022-04-17 ENCOUNTER — Ambulatory Visit
Admission: RE | Admit: 2022-04-17 | Discharge: 2022-04-17 | Disposition: A | Discharge status: Home / Self Care | Payer: 59 | Source: Ambulatory Visit | Attending: Family Medicine | Admitting: Family Medicine

## 2022-04-17 DIAGNOSIS — G8929 Other chronic pain: Secondary | ICD-10-CM

## 2022-04-17 NOTE — Telephone Encounter (Signed)
Pt stated that she had an US done this AM and is requesting Dr. Carlean Purl to review the [place] and make any recommendations with the results. Pt made aware that Dr. Carlean Purl is out of the office this week and will return next week.  Pt scheduled for an office visit with Alonza Bogus PA on 04/30/2022 at 1:30 PM. Pt made aware. Pt verbalized understanding with all questions answered.

## 2022-04-18 NOTE — Telephone Encounter (Signed)
Pt made aware of [place] results: Pt offered sooner appointment: Pt previously scheduled to see Alonza Bogus PA 04/30/2022  at 1:30 PM. Pt stated that she just arrived in [place] and she will just keep the appointment on 04/30/2022: Pt verbalized understanding with all questions answered.

## 2022-04-18 NOTE — Telephone Encounter (Signed)
Left message for pt to call back  °

## 2022-04-18 NOTE — Telephone Encounter (Signed)
Ultrasound is normal  You may offer her an 1130 or 350 PM appointment on my schedule to be seen sooner than 4/11

## 2022-04-30 ENCOUNTER — Ambulatory Visit: Payer: 59 | Admitting: Gastroenterology

## 2022-04-30 ENCOUNTER — Encounter: Payer: Self-pay | Admitting: Gastroenterology

## 2022-04-30 VITALS — BP 102/60 | HR 74 | Ht 64.0 in | Wt 132.0 lb

## 2022-04-30 DIAGNOSIS — R1011 Right upper quadrant pain: Secondary | ICD-10-CM | POA: Diagnosis not present

## 2022-04-30 DIAGNOSIS — R1010 Upper abdominal pain, unspecified: Secondary | ICD-10-CM

## 2022-04-30 NOTE — Progress Notes (Signed)
04/30/2022 Renee [name] [id number] 09/03/1970   HISTORY OF PRESENT ILLNESS: This is a 52 year old female who is a patient of Dr. Celesta Aver.  She is here today with complaints of right upper quadrant abdominal pain.  Says that she has had this issue dating back probably 10 years as she had an ultrasound to evaluate right upper quadrant abdominal pain 10 years ago.  Back in the fall was placed on pantoprazole 40 mg daily and had some improvement in symptoms with that so  it was thought maybe it was more acid/peptic related.  H. pylori testing was negative.  Recently, however, they are in the process of moving to [place] and she has been eating out a lot more.  Has had a flare of this pain, waking her up in the middle the night.  Had another ultrasound recently that was unremarkable.  Does have family history of gallbladder issues.  Moves her bowels regularly.  No nausea or vomiting.  Pain is always in RUQ and radiates to the right back.   Past Medical History:  Diagnosis Date   Headache    Lumbar radiculitis    Numbness and tingling in left arm    due to overdoing in in the gym with exercising    Thyroid disease    Vision disturbance    Past Surgical History:  Procedure Laterality Date   COLONOSCOPY  2021   VAGINAL HYSTERECTOMY N/A 12/02/2016   Procedure: HYSTERECTOMY VAGINAL WITH BILATERAL SALPINGECTOMY;  Surgeon: Dian Queen, MD;  Location: [facility];  Service: Gynecology;  Laterality: N/A;   WISDOM TOOTH EXTRACTION      reports that she has never smoked. She has never used smokeless tobacco. She reports that she does not drink alcohol and does not use drugs. family history includes Atrial fibrillation in her mother; Colon cancer in her paternal grandmother; Diabetes in her mother; Diverticulitis in her father; Hypertension in her mother. No Known Allergies    Outpatient Encounter Medications as of 04/30/2022  Medication Sig   Cholecalciferol (VITAMIN D3)  1.25 MG (50000 UT) CAPS Take 5,000 Int'l Units by mouth daily.   Cyanocobalamin (VITAMIN B 12 PO) Take by mouth.   ibuprofen (ADVIL,MOTRIN) 600 MG tablet Take 1 tablet (600 mg total) by mouth every 6 (six) hours as needed (mild pain).   Magnesium 250 MG TABS Take 1,350 mg by mouth daily.   Nutritional Supplements (DHEA PO) Take 15 mg by mouth daily.   Omega-3 Fatty Acids (FISH OIL) 1000 MG CAPS Take by mouth.   OVER THE COUNTER MEDICATION Digestive enzymes- use as needed when eating a fatty meal   Pregnenolone Micronized (PREGNENOLONE PO) Take 75 mg by mouth 4 (four) times a week.   progesterone (PROMETRIUM) 100 MG capsule Take 100 mg by mouth at bedtime.   Pyridoxine HCl (B-6 PO) Take by mouth.   thyroid (ARMOUR) 30 MG tablet Take 30 mg by mouth daily.   Phytonadione (MEPHYTON PO) Take 150 mcg by mouth daily.   [DISCONTINUED] Cholecalciferol (VITAMIN D) 2000 units CAPS Take by mouth.   No facility-administered encounter medications on file as of 04/30/2022.     REVIEW OF SYSTEMS  : All other systems reviewed and negative except where noted in the History of Present Illness.   PHYSICAL EXAM: BP 102/60   Pulse 74   Ht 5\' 4"  (1.626 m)   Wt 132 lb (59.9 kg)   LMP 11/20/2016 (Approximate)   BMI 22.66 kg/m  General: Well developed  white female in no acute distress Head: Normocephalic and atraumatic Eyes:  Sclerae anicteric, conjunctiva pink. Ears: Normal auditory acuity Lungs: Clear throughout to auscultation; no W/R/R. Heart: Regular rate and rhythm; no M/R/G. Abdomen: Soft, non-distended.  BS present.  Minimal RUQ TTP. Musculoskeletal: Symmetrical with no gross deformities  Skin: No lesions on visible extremities Extremities: No edema  Neurological: Alert oriented x 4, grossly non-focal Psychological:  Alert and cooperative. Normal mood and affect  ASSESSMENT AND PLAN: *Right upper quadrant abdominal pain: Has been intermittent for probably 10 years.  Colicky type pain, comes  and goes a lot of times after eating heavier, fatty, greasier foods.  Often wakes up until the night.  Not constant.  Recent ultrasound normal.  Has family history of gallbladder issues.  Will check a HIDA scan with [facility].  CC:  Dian Queen, MD

## 2022-04-30 NOTE — Patient Instructions (Signed)
You have been scheduled for a HIDA scan at [facility] Radiology (1st floor) on 05/13/2022. Please arrive 30 minutes prior to your scheduled appointment at  [id number]. Make certain not to have anything to eat or drink at least 8 hours prior to your test. Should this appointment date or time not work well for you, please call radiology scheduling at [phone]. No Pain Medications 8 hours prior to test _____________________________________________________________________ hepatobiliary (HIDA) scan is an imaging procedure used to diagnose problems in the liver, gallbladder and bile ducts. In the HIDA scan, a radioactive chemical or tracer is injected into a vein in your arm. The tracer is handled by the liver like bile. Bile is a fluid produced and excreted by your liver that helps your digestive system break down fats in the foods you eat. Bile is stored in your gallbladder and the gallbladder releases the bile when you eat a meal. A special nuclear medicine scanner (gamma camera) tracks the flow of the tracer from your liver into your gallbladder and small intestine.  During your HIDA scan  You'll be asked to change into a hospital gown before your HIDA scan begins. Your health care team will position you on a table, usually on your back. The radioactive tracer is then injected into a vein in your arm.The tracer travels through your bloodstream to your liver, where it's taken up by the bile-producing cells. The radioactive tracer travels with the bile from your liver into your gallbladder and through your bile ducts to your small intestine.You may feel some pressure while the radioactive tracer is injected into your vein. As you lie on the table, a special gamma camera is positioned over your abdomen taking pictures of the tracer as it moves through your body. The gamma camera takes pictures continually for about an hour. You'll need to keep still during the HIDA scan. This can become uncomfortable, but you may find  that you can lessen the discomfort by taking deep breaths and thinking about other things. Tell your health care team if you're uncomfortable. The radiologist will watch on a computer the progress of the radioactive tracer through your body. The HIDA scan may be stopped when the radioactive tracer is seen in the gallbladder and enters your small intestine. This typically takes about an hour. In some cases extra imaging will be performed if original images aren't satisfactory, if morphine is given to help visualize the gallbladder or if the medication CCK is given to look at the contraction of the gallbladder. This test typically takes 2 hours to complete. ________________________________________________________________________   Due to recent changes in healthcare laws, you may see the results of your imaging and laboratory studies on MyChart before your provider has had a chance to review them.  We understand that in some cases there may be results that are confusing or concerning to you. Not all laboratory results come back in the same time frame and the provider may be waiting for multiple results in order to interpret others.  Please give [place] 48 hours in order for your provider to thoroughly review all the results before contacting the office for clarification of your results.    I appreciate the  opportunity to care for you  Thank You   Janett Billow Zehr,PA-C

## 2022-05-06 NOTE — Progress Notes (Signed)
[facility] [address] [place] Phone: [phone] Subjective:   [name], am serving as a scribe for Dr. Hulan Saas.  I'm seeing this patient by the request  of:  Dian Queen, MD  CC: Back and neck pain follow-up  [id number]  Renee [name] is a 52 y.o. female coming in with complaint of back and neck pain. OMT on 03/13/2022. Patient states doing well. Same per usual. Having a bit of a flare in her neck today. No new concerns.          Reviewed prior external information including notes and imaging from previsou exam, outside providers and external EMR if available.   As well as notes that were available from care everywhere and other healthcare systems.  Past medical history, social, surgical and family history all reviewed in electronic medical record.  No pertanent information unless stated regarding to the chief complaint.   Past Medical History:  Diagnosis Date   Headache    Lumbar radiculitis    Numbness and tingling in left arm    due to overdoing in in the gym with exercising    Thyroid disease    Vision disturbance     No Known Allergies   Review of Systems:  No headache, visual changes, nausea, vomiting, diarrhea, constipation, dizziness, abdominal pain, skin rash, fevers, chills, night sweats, weight loss, swollen lymph nodes, body aches, joint swelling, chest pain, shortness of breath, mood changes. POSITIVE muscle aches  Objective  Blood pressure 104/66, pulse 65, height 5\' 4"  (1.626 m), last menstrual period 11/20/2016, SpO2 98 %.   General: No apparent distress alert and oriented x3 mood and affect normal, dressed appropriately.  HEENT: Pupils equal, extraocular movements intact  Respiratory: Patient's speak in full sentences and does not appear short of breath  Cardiovascular: No lower extremity edema, non tender, no erythema  Patient does have significant tightness around the neck  again.  Patient does have some limited sidebending.  Most of it does seem to be musculature.  Does have pain over the trapezius bilaterally as well.  Osteopathic findings  C2 flexed rotated and side bent right C4 flexed rotated and side bent right C7 flexed rotated and side bent left T3 extended rotated and side bent right inhaled rib T5 extended rotated and side bent left T7 extended rotated and side bent left L2 flexed rotated and side bent right Sacrum right on right       Assessment and Plan:  Neck pain Chronic problem with exacerbation, still some underlying anxiety that is likely contributing as well.  Discussed icing regimen and home exercises, which activities to do and which ones to avoid, increase activity slowly.  Follow-up again in 6 to 8 weeks patient will be moving to [place] in the near future    Nonallopathic problems  Decision today to treat with OMT was based on Physical Exam  After verbal consent patient was treated with HVLA, ME, FPR techniques in cervical, rib, thoracic, lumbar, and sacral  areas  Patient tolerated the procedure well with improvement in symptoms  Patient given exercises, stretches and lifestyle modifications  See medications in patient instructions if given  Patient will follow up in 4-8 weeks     The above documentation has been reviewed and is accurate and complete [name], DO         Note: This dictation was prepared with Dragon dictation along with smaller phrase technology. Any transcriptional errors that  result from this process are unintentional.

## 2022-05-13 ENCOUNTER — Encounter (HOSPITAL_COMMUNITY)
Admission: RE | Admit: 2022-05-13 | Discharge: 2022-05-13 | Disposition: A | Discharge status: Home / Self Care | Payer: 59 | Source: Ambulatory Visit | Attending: Gastroenterology | Admitting: Gastroenterology

## 2022-05-13 DIAGNOSIS — R1011 Right upper quadrant pain: Secondary | ICD-10-CM | POA: Diagnosis present

## 2022-05-13 MED ORDER — TECHNETIUM TC 99M MEBROFENIN IV KIT
5.1600 | PACK | Freq: Once | INTRAVENOUS | Status: AC | PRN
  Administered 2022-05-13: 5.16 via INTRAVENOUS

## 2022-05-14 ENCOUNTER — Ambulatory Visit (INDEPENDENT_AMBULATORY_CARE_PROVIDER_SITE_OTHER): Payer: 59 | Admitting: Family Medicine

## 2022-05-14 ENCOUNTER — Encounter: Payer: Self-pay | Admitting: Family Medicine

## 2022-05-14 VITALS — BP 104/66 | HR 65 | Ht 64.0 in

## 2022-05-14 DIAGNOSIS — M542 Cervicalgia: Secondary | ICD-10-CM | POA: Diagnosis not present

## 2022-05-14 DIAGNOSIS — M9902 Segmental and somatic dysfunction of thoracic region: Secondary | ICD-10-CM | POA: Diagnosis not present

## 2022-05-14 DIAGNOSIS — M9903 Segmental and somatic dysfunction of lumbar region: Secondary | ICD-10-CM

## 2022-05-14 DIAGNOSIS — M9901 Segmental and somatic dysfunction of cervical region: Secondary | ICD-10-CM

## 2022-05-14 DIAGNOSIS — M9904 Segmental and somatic dysfunction of sacral region: Secondary | ICD-10-CM | POA: Diagnosis not present

## 2022-05-14 DIAGNOSIS — M9908 Segmental and somatic dysfunction of rib cage: Secondary | ICD-10-CM

## 2022-05-14 NOTE — Patient Instructions (Signed)
Good to see you See me again in 6-7 weeks Yoga wheel

## 2022-05-14 NOTE — Assessment & Plan Note (Signed)
Chronic problem with exacerbation, still some underlying anxiety that is likely contributing as well.  Discussed icing regimen and home exercises, which activities to do and which ones to avoid, increase activity slowly.  Follow-up again in 6 to 8 weeks patient will be moving to [place] in the near future

## 2022-05-22 ENCOUNTER — Ambulatory Visit: Payer: 59 | Admitting: Nurse Practitioner

## 2022-06-13 ENCOUNTER — Ambulatory Visit: Payer: 59 | Admitting: Internal Medicine

## 2022-06-25 ENCOUNTER — Ambulatory Visit: Payer: 59 | Admitting: Family Medicine

## 2022-07-10 NOTE — Progress Notes (Signed)
[facility] [address] [place] [address] Phone: [phone] Subjective:   Bruce Donath, am serving as a scribe for Dr. Antoine Primas.  I'm seeing this patient by the request  of:  Marcelle Overlie, MD  CC: Back and neck pain follow-up  [id number]  Renee [name] is a 52 y.o. female coming in with complaint of back and neck pain. OMT 05/14/2022. Patient states overall has been doing relatively well, has been lifting more weights recently.  Has been back packing boxes with their near move to [place].  Medications patient has been prescribed: None  Taking:         Reviewed prior external information including notes and imaging from previsou exam, outside providers and external EMR if available.   As well as notes that were available from care everywhere and other healthcare systems.  Past medical history, social, surgical and family history all reviewed in electronic medical record.  No pertanent information unless stated regarding to the chief complaint.   Past Medical History:  Diagnosis Date   Headache    Lumbar radiculitis    Numbness and tingling in left arm    due to overdoing in in the gym with exercising    Thyroid disease    Vision disturbance     No Known Allergies   Review of Systems:  No headache, visual changes, nausea, vomiting, diarrhea, constipation, dizziness, abdominal pain, skin rash, fevers, chills, night sweats, weight loss, swollen lymph nodes, joint swelling, chest pain, shortness of breath, mood changes. POSITIVE muscle aches, body aches  Objective  Blood pressure 110/78, height 5\' 4"  (1.626 m), weight 132 lb (59.9 kg), last menstrual period 11/20/2016.   General: No apparent distress alert and oriented x3 mood and affect normal, dressed appropriately.  HEENT: Pupils equal, extraocular movements intact  Respiratory: Patient's speak in full sentences and does not appear short of breath  Cardiovascular:  No lower extremity edema, non tender, no erythema  Neck exam does have some limited sidebending bilaterally.  Tightness noted in the occipital region right greater than left.  Low back significant tightness noted in the paraspinal musculature on the right side.  Osteopathic findings  C2 flexed rotated and side bent right C6 flexed rotated and side bent left T3 extended rotated and side bent right inhaled rib T9 extended rotated and side bent left L2 flexed rotated and side bent right L5 flexed rotated and side bent right Sacrum right on right       Assessment and Plan:  Left lumbar radiculitis Low back pain follow-up, tenderness to palpation still noted is been responding fairly well.  Do feel that there is scapular dyskinesis that has been contributing to some of the discomfort as well.  Patient should do well with conservative therapy and home exercises.  [place] to another [place] and we will see if she can find care down there.  Follow-up with me on an as-needed basis.  Can refill any medications for up to 1 year    Nonallopathic problems  Decision today to treat with OMT was based on Physical Exam  After verbal consent patient was treated with HVLA, ME, FPR techniques in cervical, rib, thoracic, lumbar, and sacral  areas  Patient tolerated the procedure well with improvement in symptoms  Patient given exercises, stretches and lifestyle modifications  See medications in patient instructions if given  Patient will follow up in 4-8 weeks    The above documentation has been reviewed and  is accurate and complete Judi Saa, DO          Note: This dictation was prepared with Dragon dictation along with smaller phrase technology. Any transcriptional errors that result from this process are unintentional.

## 2022-07-11 ENCOUNTER — Ambulatory Visit: Payer: 59 | Admitting: Family Medicine

## 2022-07-11 ENCOUNTER — Encounter: Payer: Self-pay | Admitting: Family Medicine

## 2022-07-11 VITALS — BP 110/78 | Ht 64.0 in | Wt 132.0 lb

## 2022-07-11 DIAGNOSIS — M9903 Segmental and somatic dysfunction of lumbar region: Secondary | ICD-10-CM

## 2022-07-11 DIAGNOSIS — M9904 Segmental and somatic dysfunction of sacral region: Secondary | ICD-10-CM

## 2022-07-11 DIAGNOSIS — M9902 Segmental and somatic dysfunction of thoracic region: Secondary | ICD-10-CM

## 2022-07-11 DIAGNOSIS — M9901 Segmental and somatic dysfunction of cervical region: Secondary | ICD-10-CM | POA: Diagnosis not present

## 2022-07-11 DIAGNOSIS — M5416 Radiculopathy, lumbar region: Secondary | ICD-10-CM

## 2022-07-11 DIAGNOSIS — M9908 Segmental and somatic dysfunction of rib cage: Secondary | ICD-10-CM

## 2022-07-11 NOTE — Assessment & Plan Note (Signed)
Low back pain follow-up, tenderness to palpation still noted is been responding fairly well.  Do feel that there is scapular dyskinesis that has been contributing to some of the discomfort as well.  Patient should do well with conservative therapy and home exercises.  Will be moving to another state and we will see if she can find care down there.  Follow-up with me on an as-needed basis.  Can refill any medications for up to 1 year

## 2022-07-11 NOTE — Patient Instructions (Signed)
Osteopathic Manipulation Therapy Scapular dyskinesis You know where we are if you need [place]

## 2022-11-25 ENCOUNTER — Encounter: Payer: Self-pay | Admitting: Obstetrics and Gynecology
